# Patient Record
Sex: Female | Born: 1980 | Race: White | Hispanic: No | Marital: Married | Smoking: Never smoker
Health system: Southern US, Community
[De-identification: ages and names within clinical notes are randomized; demographics above are authoritative.]

## PROBLEM LIST (undated history)

## (undated) ENCOUNTER — Inpatient Hospital Stay (HOSPITAL_COMMUNITY): Payer: Self-pay

## (undated) DIAGNOSIS — N879 Dysplasia of cervix uteri, unspecified: Secondary | ICD-10-CM

## (undated) DIAGNOSIS — R112 Nausea with vomiting, unspecified: Secondary | ICD-10-CM

## (undated) DIAGNOSIS — D649 Anemia, unspecified: Secondary | ICD-10-CM

## (undated) DIAGNOSIS — Z9889 Other specified postprocedural states: Secondary | ICD-10-CM

## (undated) HISTORY — DX: Dysplasia of cervix uteri, unspecified: N87.9

---
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
## 2002-01-22 ENCOUNTER — Other Ambulatory Visit: Admission: RE | Admit: 2002-01-22 | Discharge: 2002-01-22 | Payer: Self-pay | Admitting: Family Medicine

## 2003-01-14 ENCOUNTER — Other Ambulatory Visit: Admission: RE | Admit: 2003-01-14 | Discharge: 2003-01-14 | Payer: Self-pay | Admitting: Obstetrics and Gynecology

## 2004-01-17 ENCOUNTER — Other Ambulatory Visit: Admission: RE | Admit: 2004-01-17 | Discharge: 2004-01-17 | Payer: Self-pay | Admitting: Obstetrics and Gynecology

## 2004-06-10 HISTORY — PX: WISDOM TOOTH EXTRACTION: SHX21

## 2005-01-24 ENCOUNTER — Other Ambulatory Visit: Admission: RE | Admit: 2005-01-24 | Discharge: 2005-01-24 | Payer: Self-pay | Admitting: Obstetrics and Gynecology

## 2010-05-07 DIAGNOSIS — R87612 Low grade squamous intraepithelial lesion on cytologic smear of cervix (LGSIL): Secondary | ICD-10-CM | POA: Insufficient documentation

## 2010-06-10 HISTORY — PX: GYNECOLOGIC CRYOSURGERY: SHX857

## 2012-06-10 NOTE — L&D Delivery Note (Signed)
Delivery Note  SVD viable Female Apgars 9,9 over 2nd degree ML lac.  Placenta delivered spontaneously intact with 3VC. Repair with 2-0 Chromic with good support and hemostasis noted and R/V exam confirms.  PH art was sent.  [place] cord blood was not done Pt continued to have uterine atony with bleeding with massage.  Manual exam of the uterus evacuated clots and no residual placenta palpated.  Re exam of placenta also revealed no obvious missing placenta.  Patient given pitocin IV, Methergine IM (0.2mg ) then cytotec per rectum.  Now only min - mild bleeding with vigorous massage. Total ebl 800cc PPH protocol was activated but patient other than some nausea states shes ok.  Mother and baby were doing well.  EBL 800cc  Candice Camp, MD

## 2012-07-09 LAB — OB RESULTS CONSOLE RUBELLA ANTIBODY, IGM: Rubella: IMMUNE

## 2012-07-09 LAB — OB RESULTS CONSOLE HIV ANTIBODY (ROUTINE TESTING): HIV: NONREACTIVE

## 2012-07-09 LAB — OB RESULTS CONSOLE ABO/RH: RH Type: NEGATIVE

## 2012-07-31 ENCOUNTER — Inpatient Hospital Stay (HOSPITAL_COMMUNITY): Admission: AD | Admit: 2012-07-31 | Payer: Self-pay | Source: Ambulatory Visit | Admitting: Obstetrics and Gynecology

## 2012-12-17 ENCOUNTER — Inpatient Hospital Stay (HOSPITAL_COMMUNITY): Admission: AD | Admit: 2012-12-17 | Payer: Self-pay | Source: Ambulatory Visit | Admitting: Obstetrics and Gynecology

## 2013-01-18 LAB — OB RESULTS CONSOLE GBS: GBS: POSITIVE

## 2013-02-01 ENCOUNTER — Inpatient Hospital Stay (HOSPITAL_COMMUNITY)
Admission: AD | Admit: 2013-02-01 | Discharge: 2013-02-04 | DRG: 372 | Disposition: A | Discharge status: Home / Self Care | Payer: BC Managed Care – PPO | Source: Ambulatory Visit | Attending: Obstetrics and Gynecology | Admitting: Obstetrics and Gynecology

## 2013-02-01 ENCOUNTER — Encounter (HOSPITAL_COMMUNITY): Payer: Self-pay

## 2013-02-01 ENCOUNTER — Inpatient Hospital Stay (HOSPITAL_COMMUNITY): Payer: BC Managed Care – PPO | Admitting: Anesthesiology

## 2013-02-01 ENCOUNTER — Encounter (HOSPITAL_COMMUNITY): Payer: Self-pay | Admitting: Anesthesiology

## 2013-02-01 DIAGNOSIS — O99892 Other specified diseases and conditions complicating childbirth: Secondary | ICD-10-CM | POA: Diagnosis present

## 2013-02-01 DIAGNOSIS — Z22330 Carrier of Group B streptococcus: Secondary | ICD-10-CM

## 2013-02-01 HISTORY — DX: Other specified postprocedural states: Z98.890

## 2013-02-01 LAB — CBC
HCT: 32.7 % — ABNORMAL LOW (ref 36.0–46.0)
Hemoglobin: 11.3 g/dL — ABNORMAL LOW (ref 12.0–15.0)
MCH: 31.9 pg (ref 26.0–34.0)
MCHC: 34.6 g/dL (ref 30.0–36.0)
RBC: 3.54 MIL/uL — ABNORMAL LOW (ref 3.87–5.11)

## 2013-02-01 MED ORDER — ONDANSETRON HCL 4 MG/2ML IJ SOLN
4.0000 mg | Freq: Four times a day (QID) | INTRAMUSCULAR | Status: DC | PRN
  Administered 2013-02-02: 4 mg via INTRAVENOUS
  Filled 2013-02-01: qty 2

## 2013-02-01 MED ORDER — FENTANYL 2.5 MCG/ML BUPIVACAINE 1/10 % EPIDURAL INFUSION (WH - ANES)
14.0000 mL/h | INTRAMUSCULAR | Status: DC | PRN
  Administered 2013-02-01: 14 mL/h via EPIDURAL
  Filled 2013-02-01: qty 125

## 2013-02-01 MED ORDER — TERBUTALINE SULFATE 1 MG/ML IJ SOLN: 0.2500 mg | Freq: Once | INTRAMUSCULAR | Status: AC | PRN

## 2013-02-01 MED ORDER — LIDOCAINE HCL (PF) 1 % IJ SOLN
INTRAMUSCULAR | Status: DC | PRN
  Administered 2013-02-01 (×4): 4 mL

## 2013-02-01 MED ORDER — EPHEDRINE 5 MG/ML INJ
10.0000 mg | INTRAVENOUS | Status: DC | PRN
  Filled 2013-02-01: qty 2

## 2013-02-01 MED ORDER — LIDOCAINE HCL (PF) 1 % IJ SOLN
30.0000 mL | INTRAMUSCULAR | Status: AC | PRN
  Administered 2013-02-02: 30 mL via SUBCUTANEOUS
  Filled 2013-02-01 (×2): qty 30

## 2013-02-01 MED ORDER — OXYTOCIN BOLUS FROM INFUSION
500.0000 mL | INTRAVENOUS | Status: DC
  Administered 2013-02-02: 500 mL via INTRAVENOUS

## 2013-02-01 MED ORDER — PHENYLEPHRINE 40 MCG/ML (10ML) SYRINGE FOR IV PUSH (FOR BLOOD PRESSURE SUPPORT)
80.0000 ug | PREFILLED_SYRINGE | INTRAVENOUS | Status: DC | PRN
  Filled 2013-02-01: qty 2

## 2013-02-01 MED ORDER — SODIUM CHLORIDE 0.9 % IV SOLN
2.0000 g | Freq: Four times a day (QID) | INTRAVENOUS | Status: DC
  Administered 2013-02-01 (×2): 2 g via INTRAVENOUS
  Filled 2013-02-01 (×5): qty 2000

## 2013-02-01 MED ORDER — OXYTOCIN 40 UNITS IN LACTATED RINGERS INFUSION - SIMPLE MED
62.5000 mL/h | INTRAVENOUS | Status: DC
  Administered 2013-02-02 (×2): 62.5 mL/h via INTRAVENOUS
  Filled 2013-02-01 (×2): qty 1000

## 2013-02-01 MED ORDER — EPHEDRINE 5 MG/ML INJ
10.0000 mg | INTRAVENOUS | Status: DC | PRN
  Filled 2013-02-01: qty 2
  Filled 2013-02-01: qty 4

## 2013-02-01 MED ORDER — ACETAMINOPHEN 325 MG PO TABS: 650.0000 mg | ORAL_TABLET | ORAL | Status: DC | PRN

## 2013-02-01 MED ORDER — OXYTOCIN 40 UNITS IN LACTATED RINGERS INFUSION - SIMPLE MED
1.0000 m[IU]/min | INTRAVENOUS | Status: DC
  Administered 2013-02-01: 2 m[IU]/min via INTRAVENOUS

## 2013-02-01 MED ORDER — OXYCODONE-ACETAMINOPHEN 5-325 MG PO TABS: 1.0000 | ORAL_TABLET | ORAL | Status: DC | PRN

## 2013-02-01 MED ORDER — CITRIC ACID-SODIUM CITRATE 334-500 MG/5ML PO SOLN: 30.0000 mL | ORAL | Status: DC | PRN

## 2013-02-01 MED ORDER — DIPHENHYDRAMINE HCL 50 MG/ML IJ SOLN: 12.5000 mg | INTRAMUSCULAR | Status: DC | PRN

## 2013-02-01 MED ORDER — IBUPROFEN 600 MG PO TABS: 600.0000 mg | ORAL_TABLET | Freq: Four times a day (QID) | ORAL | Status: DC | PRN

## 2013-02-01 MED ORDER — LACTATED RINGERS IV SOLN
INTRAVENOUS | Status: DC
  Administered 2013-02-01 (×2): via INTRAVENOUS

## 2013-02-01 MED ORDER — PHENYLEPHRINE 40 MCG/ML (10ML) SYRINGE FOR IV PUSH (FOR BLOOD PRESSURE SUPPORT)
80.0000 ug | PREFILLED_SYRINGE | INTRAVENOUS | Status: DC | PRN
  Filled 2013-02-01: qty 5
  Filled 2013-02-01: qty 2

## 2013-02-01 MED ORDER — LACTATED RINGERS IV SOLN
500.0000 mL | INTRAVENOUS | Status: DC | PRN
  Administered 2013-02-02: 800 mL via INTRAVENOUS

## 2013-02-01 MED ORDER — LACTATED RINGERS IV SOLN: 500.0000 mL | Freq: Once | INTRAVENOUS | Status: DC

## 2013-02-01 NOTE — Anesthesia Procedure Notes (Signed)
Epidural Patient location during procedure: OB Start time: 02/01/2013 7:12 PM  Staffing Performed by: anesthesiologist   Preanesthetic Checklist Completed: patient identified, site marked, surgical consent, pre-op evaluation, timeout performed, IV checked, risks and benefits discussed and monitors and equipment checked  Epidural Patient position: sitting Prep: site prepped and draped and DuraPrep Patient monitoring: continuous pulse ox and blood pressure Approach: midline Injection technique: LOR air  Needle:  Needle type: Tuohy  Needle gauge: 17 G Needle length: 9 cm and 9 Needle insertion depth: 4.5 cm Catheter type: closed end flexible Catheter size: 19 Gauge Catheter at skin depth: 9.5 cm Test dose: negative  Assessment Events: blood not aspirated, injection not painful, no injection resistance, negative IV test and no paresthesia  Additional Notes Discussed risk of headache, infection, bleeding, nerve injury and failed or incomplete block.  Patient voices understanding and wishes to proceed.  Epidural placed easily on first attempt.  No paresthesia.  Patient tolerated procedure well with no apparent complications.  Jasmine December, MDReason for block:procedure for pain

## 2013-02-01 NOTE — Anesthesia Preprocedure Evaluation (Signed)

## 2013-02-01 NOTE — H&P (Signed)
Desiree Garza is a 32 y.o. female presenting for labor sxs.  Seen in office noted to be 7 cm and GBS+ admiited for abx and labor. History OB History   Grav Para Term Preterm Abortions TAB SAB Ect Mult Living   1              Past Medical History  Diagnosis Date  . Medical history non-contributory    Past Surgical History  Procedure Laterality Date  . Gynecologic cryosurgery  2012  . Wisdom tooth extraction  2006   Family History: family history is not on file. Social History:  reports that she does not drink alcohol or use illicit drugs. Her tobacco history is not on file.   Prenatal Transfer Tool  Maternal Diabetes: No Genetic Screening: Normal Maternal Ultrasounds/Referrals: Normal Fetal Ultrasounds or other Referrals:  None Maternal Substance Abuse:  No Significant Maternal Medications:  None Significant Maternal Lab Results:  None Other Comments:  None  ROS  Dilation: 6.5 Effacement (%): 90 Station: 0 Exam by:: L. Mcdaniel RN Blood pressure 127/78, pulse 77, temperature 98.2 F (36.8 C), temperature source Oral, resp. rate 18, height 5\' 3"  (1.6 m), weight 57.153 kg (126 lb). Exam Physical Exam  Prenatal labs: ABO, Rh: A/Negative/-- (01/30 0000) Antibody: Negative (01/30 0000) Rubella: Immune (01/30 0000) RPR: Nonreactive (01/30 0000)  HBsAg: Negative (01/30 0000)  HIV: Non-reactive (01/30 0000)  GBS: Positive, Positive, Positive, Positive, Positive (08/11 0000)   Assessment/Plan: IUP term in acive labor Abx for GBS Anticipate svd   Ilias Stcharles C 02/01/2013, 5:35 PM

## 2013-02-02 ENCOUNTER — Encounter (HOSPITAL_COMMUNITY): Payer: Self-pay | Admitting: *Deleted

## 2013-02-02 LAB — POSTPARTUM HEMORRHAGE PROTOCOL (BB NOTIFICATION)

## 2013-02-02 LAB — CBC
Hemoglobin: 7.5 g/dL — ABNORMAL LOW (ref 12.0–15.0)
MCH: 32.3 pg (ref 26.0–34.0)
RBC: 2.32 MIL/uL — ABNORMAL LOW (ref 3.87–5.11)
WBC: 11.4 10*3/uL — ABNORMAL HIGH (ref 4.0–10.5)

## 2013-02-02 LAB — DIC (DISSEMINATED INTRAVASCULAR COAGULATION)PANEL
Platelets: 137 10*3/uL — ABNORMAL LOW (ref 150–400)
Smear Review: NONE SEEN

## 2013-02-02 LAB — ABO/RH: ABO/RH(D): A NEG

## 2013-02-02 MED ORDER — LANOLIN HYDROUS EX OINT: TOPICAL_OINTMENT | CUTANEOUS | Status: DC | PRN

## 2013-02-02 MED ORDER — DIBUCAINE 1 % RE OINT: 1.0000 "application " | TOPICAL_OINTMENT | RECTAL | Status: DC | PRN

## 2013-02-02 MED ORDER — SIMETHICONE 80 MG PO CHEW: 80.0000 mg | CHEWABLE_TABLET | ORAL | Status: DC | PRN

## 2013-02-02 MED ORDER — WITCH HAZEL-GLYCERIN EX PADS: 1.0000 "application " | MEDICATED_PAD | CUTANEOUS | Status: DC | PRN

## 2013-02-02 MED ORDER — BENZOCAINE-MENTHOL 20-0.5 % EX AERO
1.0000 "application " | INHALATION_SPRAY | CUTANEOUS | Status: DC | PRN
  Administered 2013-02-02: 1 via TOPICAL
  Filled 2013-02-02: qty 56

## 2013-02-02 MED ORDER — DIPHENHYDRAMINE HCL 25 MG PO CAPS: 25.0000 mg | ORAL_CAPSULE | Freq: Four times a day (QID) | ORAL | Status: DC | PRN

## 2013-02-02 MED ORDER — METHYLERGONOVINE MALEATE 0.2 MG PO TABS
ORAL_TABLET | ORAL | Status: AC
  Administered 2013-02-02: 0.2 mg via ORAL
  Filled 2013-02-02: qty 1

## 2013-02-02 MED ORDER — TETANUS-DIPHTH-ACELL PERTUSSIS 5-2.5-18.5 LF-MCG/0.5 IM SUSP: 0.5000 mL | Freq: Once | INTRAMUSCULAR | Status: DC

## 2013-02-02 MED ORDER — MISOPROSTOL 200 MCG PO TABS
ORAL_TABLET | ORAL | Status: AC
Start: 2013-02-02 — End: 2013-02-02
  Administered 2013-02-02: 800 ug via RECTAL
  Filled 2013-02-02: qty 4

## 2013-02-02 MED ORDER — MEDROXYPROGESTERONE ACETATE 150 MG/ML IM SUSP: 150.0000 mg | INTRAMUSCULAR | Status: DC | PRN

## 2013-02-02 MED ORDER — METHYLERGONOVINE MALEATE 0.2 MG/ML IJ SOLN: 0.2000 mg | Freq: Once | INTRAMUSCULAR | Status: DC

## 2013-02-02 MED ORDER — MISOPROSTOL 200 MCG PO TABS
800.0000 ug | ORAL_TABLET | Freq: Once | ORAL | Status: AC
  Administered 2013-02-02: 800 ug via RECTAL

## 2013-02-02 MED ORDER — OXYCODONE-ACETAMINOPHEN 5-325 MG PO TABS
1.0000 | ORAL_TABLET | ORAL | Status: DC | PRN
  Administered 2013-02-02: 1 via ORAL
  Filled 2013-02-02: qty 1

## 2013-02-02 MED ORDER — PRENATAL MULTIVITAMIN CH
1.0000 | ORAL_TABLET | Freq: Every day | ORAL | Status: DC
  Administered 2013-02-02 – 2013-02-04 (×3): 1 via ORAL
  Filled 2013-02-02 (×3): qty 1

## 2013-02-02 MED ORDER — METHYLERGONOVINE MALEATE 0.2 MG PO TABS
0.2000 mg | ORAL_TABLET | Freq: Once | ORAL | Status: AC
  Administered 2013-02-02: 0.2 mg via ORAL
  Filled 2013-02-02: qty 1

## 2013-02-02 MED ORDER — METHYLERGONOVINE MALEATE 0.2 MG/ML IJ SOLN
INTRAMUSCULAR | Status: AC
Start: 2013-02-02 — End: 2013-02-02
  Administered 2013-02-02: 0.2 mg via INTRAMUSCULAR
  Filled 2013-02-02: qty 1

## 2013-02-02 MED ORDER — ONDANSETRON HCL 4 MG/2ML IJ SOLN: 4.0000 mg | INTRAMUSCULAR | Status: DC | PRN

## 2013-02-02 MED ORDER — ONDANSETRON HCL 4 MG PO TABS: 4.0000 mg | ORAL_TABLET | ORAL | Status: DC | PRN

## 2013-02-02 MED ORDER — IBUPROFEN 600 MG PO TABS
600.0000 mg | ORAL_TABLET | Freq: Four times a day (QID) | ORAL | Status: DC
  Administered 2013-02-02 – 2013-02-04 (×10): 600 mg via ORAL
  Filled 2013-02-02 (×10): qty 1

## 2013-02-02 MED ORDER — PRENATAL MULTIVITAMIN CH: 1.0000 | ORAL_TABLET | Freq: Every day | ORAL | Status: DC

## 2013-02-02 MED ORDER — MEASLES, MUMPS & RUBELLA VAC ~~LOC~~ INJ: 0.5000 mL | INJECTION | Freq: Once | SUBCUTANEOUS | Status: DC

## 2013-02-02 MED ORDER — METHYLERGONOVINE MALEATE 0.2 MG PO TABS
0.2000 mg | ORAL_TABLET | Freq: Four times a day (QID) | ORAL | Status: AC
  Administered 2013-02-02 (×4): 0.2 mg via ORAL
  Filled 2013-02-02 (×3): qty 1

## 2013-02-02 MED ORDER — ZOLPIDEM TARTRATE 5 MG PO TABS: 5.0000 mg | ORAL_TABLET | Freq: Every evening | ORAL | Status: DC | PRN

## 2013-02-02 MED ORDER — METHYLERGONOVINE MALEATE 0.2 MG/ML IJ SOLN
0.2000 mg | Freq: Once | INTRAMUSCULAR | Status: DC
  Administered 2013-02-02: 0.2 mg via INTRAMUSCULAR

## 2013-02-02 MED ORDER — SENNOSIDES-DOCUSATE SODIUM 8.6-50 MG PO TABS
2.0000 | ORAL_TABLET | Freq: Every day | ORAL | Status: DC
  Administered 2013-02-02 – 2013-02-03 (×2): 2 via ORAL

## 2013-02-02 NOTE — Progress Notes (Signed)
Post Partum Day 1 Subjective: no complaints, tolerating PO and bleeding minimal, has not ambulated since moved to pp unit Denies dizziness or SOB Objective: Blood pressure 98/63, pulse 85, temperature 98.3 F (36.8 C), temperature source Oral, resp. rate 16, height 5\' 3"  (1.6 m), weight 126 lb (57.153 kg), SpO2 98.00%, unknown if currently breastfeeding.  Physical Exam:  General: alert and cooperative Lochia: appropriate Uterine Fundus: firm Incision: perineum intact DVT Evaluation: No evidence of DVT seen on physical exam. Negative Homan's sign. No cords or calf tenderness. No significant calf/ankle edema.   Recent Labs  02/01/13 1600 02/02/13 0550  HGB 11.3* 7.5*  HCT 32.7* 22.0*    Assessment/Plan: Plan for discharge tomorrow and Circumcision prior to discharge May discontinue foley if patient is ambulating well. CBC in am  LOS: 1 day   River Mckercher G 02/02/2013, 8:19 AM

## 2013-02-03 LAB — CBC
HCT: 18 % — ABNORMAL LOW (ref 36.0–46.0)
Hemoglobin: 6.4 g/dL — CL (ref 12.0–15.0)
MCV: 93.3 fL (ref 78.0–100.0)
RBC: 1.93 MIL/uL — ABNORMAL LOW (ref 3.87–5.11)
WBC: 7.7 10*3/uL (ref 4.0–10.5)

## 2013-02-03 MED ORDER — FERROUS SULFATE 325 (65 FE) MG PO TABS
325.0000 mg | ORAL_TABLET | Freq: Three times a day (TID) | ORAL | Status: DC
  Administered 2013-02-03 – 2013-02-04 (×3): 325 mg via ORAL
  Filled 2013-02-03 (×3): qty 1

## 2013-02-03 MED ORDER — RHO D IMMUNE GLOBULIN 1500 UNIT/2ML IJ SOLN
300.0000 ug | Freq: Once | INTRAMUSCULAR | Status: AC
  Administered 2013-02-03: 300 ug via INTRAMUSCULAR
  Filled 2013-02-03: qty 2

## 2013-02-03 NOTE — Progress Notes (Addendum)
Post Partum Day 2 Subjective: no complaints, up ad lib, voiding and tolerating PO  Objective: Blood pressure 94/59, pulse 73, temperature 98 F (36.7 C), temperature source Oral, resp. rate 18, height 5\' 3"  (1.6 m), weight 126 lb (57.153 kg), SpO2 99.00%, unknown if currently breastfeeding.  Physical Exam:  General: alert and cooperative Lochia: appropriate Uterine Fundus: firm Incision: perineum intact DVT Evaluation: No evidence of DVT seen on physical exam. Negative Homan's sign. No cords or calf tenderness. No significant calf/ankle edema.   Recent Labs  02/02/13 0550 02/03/13 0600  HGB 7.5* 6.4*  HCT 22.0* 18.0*    Assessment/Plan: Plan for discharge tomorrow and Circumcision prior to discharge Feso4  cbc in am  LOS: 2 days   Rohan Juenger G 02/03/2013, 8:10 AM

## 2013-02-04 LAB — CBC
Hemoglobin: 5.9 g/dL — CL (ref 12.0–15.0)
MCH: 32.1 pg (ref 26.0–34.0)
MCV: 93.5 fL (ref 78.0–100.0)
RBC: 1.84 MIL/uL — ABNORMAL LOW (ref 3.87–5.11)
WBC: 5.7 10*3/uL (ref 4.0–10.5)

## 2013-02-04 LAB — RH IG WORKUP (INCLUDES ABO/RH)
Fetal Screen: NEGATIVE
Unit division: 0

## 2013-02-04 MED ORDER — FERROUS SULFATE 325 (65 FE) MG PO TABS: 325.0000 mg | ORAL_TABLET | Freq: Three times a day (TID) | ORAL | Status: DC

## 2013-02-04 MED ORDER — IBUPROFEN 600 MG PO TABS: 600.0000 mg | ORAL_TABLET | Freq: Four times a day (QID) | ORAL | Status: DC

## 2013-02-04 NOTE — Discharge Summary (Signed)
Obstetric Discharge Summary Reason for Admission: onset of labor Prenatal Procedures: ultrasound Intrapartum Procedures: spontaneous vaginal delivery Postpartum Procedures: none Complications-Operative and Postpartum: 2 degree perineal laceration Hemoglobin  Date Value Range Status  02/04/2013 5.9* 12.0 - 15.0 g/dL Final     REPEATED TO VERIFY     CRITICAL RESULT CALLED TO, READ BACK BY AND VERIFIED WITH:     LEE, B. @ 0630 ON 02/04/2013 BY [email or web address]     HCT  Date Value Range Status  02/04/2013 17.2* 36.0 - 46.0 % Final    Physical Exam:  General: alert and cooperative, ambulating without dizziness or SOB Lochia: appropriate Uterine Fundus: firm Incision: perineum intact DVT Evaluation: No evidence of DVT seen on physical exam. Negative Homan's sign. No cords or calf tenderness. No significant calf/ankle edema.  Discharge Diagnoses: Term Pregnancy-delivered  Discharge Information: Date: 02/04/2013 Activity: pelvic rest Diet: routine Medications: PNV, Ibuprofen and Iron Condition: stable Instructions: refer to practice specific booklet Discharge to: home   Newborn Data: Live born female  Birth Weight: 6 lb 12.3 oz (3070 g) APGAR: 8, 9  Home with mother.  Mykah Bellomo G 02/04/2013, 8:14 AM

## 2013-02-04 NOTE — Anesthesia Postprocedure Evaluation (Signed)
Patient stable following vaginal delivery.  

## 2013-02-04 NOTE — Progress Notes (Signed)
CRITICAL VALUE ALERT  Critical value received:  Hgb 5.9  Date of notification:  02-04-13  Time of notification:  0630  Critical value read back:yes  Nurse who received alert:  Baltazar Apo RN  MD notified (1st page):  N/A  Time of first page:   MD notified (2nd page):  Time of second page:  Responding MD:  N/A  Time MD responded:  N/A

## 2013-02-05 LAB — TYPE AND SCREEN
ABO/RH(D): A NEG
Antibody Screen: NEGATIVE
Unit division: 0

## 2013-02-15 ENCOUNTER — Ambulatory Visit (HOSPITAL_COMMUNITY)
Admission: RE | Admit: 2013-02-15 | Discharge: 2013-02-15 | Disposition: A | Discharge status: Home / Self Care | Payer: BC Managed Care – PPO | Source: Ambulatory Visit | Attending: Obstetrics and Gynecology | Admitting: Obstetrics and Gynecology

## 2013-02-15 NOTE — Lactation Note (Signed)
Adult Lactation Consultation Outpatient Visit Note  Patient Name: Desiree Garza Date of Birth: 21-Dec-1980 Gestational Age at Delivery: Unknown Type of Delivery:   Breastfeeding History: Frequency of Breastfeeding: q 1 1/2 - 3 hours Length of Feeding: 30 min Voids: QS Stools: QS  Supplementing / Method: None Pumping:  Type of Pump:   Frequency:  Volume:    Comments:    Consultation Evaluation: Mom reports that breast feeding is going much better. Reports that her nipples are much improved. Reports that baby was very fussy before appointment so she nursed him for 10 minutes before they came.  Initial Feeding Assessment: Pre-feed Weight: 7-5.9  3342g Post-feed Weight: 7- 7.5  3388g Amount Transferred: 46 cc's Comments: Troy nursed for 15 minutes here. Then off to sleep. Mom's nipples are still slightly pink but intact with no scabs noted. Breast much softer after nursing and lots of swallows noted. Great weight gain. Praise given. No further questions at present. To call prn   Total Breast milk Transferred this Visit: 46 cc's Total Supplement Given: 0  Additional Interventions:   Follow-Up With Ped To call prn with questions/ concerns.     Pamelia Hoit 02/15/2013, 5:01 PM

## 2014-04-11 ENCOUNTER — Encounter (HOSPITAL_COMMUNITY): Payer: Self-pay | Admitting: *Deleted

## 2014-12-05 LAB — OB RESULTS CONSOLE HIV ANTIBODY (ROUTINE TESTING): HIV: NONREACTIVE

## 2014-12-08 LAB — OB RESULTS CONSOLE RPR: RPR: NONREACTIVE

## 2014-12-08 LAB — OB RESULTS CONSOLE HEPATITIS B SURFACE ANTIGEN: Hepatitis B Surface Ag: NEGATIVE

## 2014-12-08 LAB — OB RESULTS CONSOLE RUBELLA ANTIBODY, IGM: RUBELLA: IMMUNE

## 2014-12-08 LAB — OB RESULTS CONSOLE HIV ANTIBODY (ROUTINE TESTING): HIV: NONREACTIVE

## 2014-12-28 LAB — OB RESULTS CONSOLE GC/CHLAMYDIA
CHLAMYDIA, DNA PROBE: NEGATIVE
GC PROBE AMP, GENITAL: NEGATIVE

## 2015-06-11 NOTE — L&D Delivery Note (Signed)
Operative Delivery Note At 6:15 PM a viable female was delivered via Vaginal, Vacuum Investment banker, operational).  Presentation: vertex; Position: Left,, Right,, Anterior; Station: +2.  Verbal consent: obtained from patient.  Risks and benefits discussed in detail.  Risks include, but are not limited to the risks of anesthesia, bleeding, infection, damage to maternal tissues, fetal cephalhematoma.  There is also the risk of inability to effect vaginal delivery of the head, or shoulder dystocia that cannot be resolved by established maneuvers, leading to the need for emergency cesarean section.  APGAR: 8, 9; weight  .   Placenta status: Intact, Spontaneous.   Cord: 3 vessels with the following complications: None.  Cord pH: not obtained  Anesthesia: Epidural  Instruments: mushroom delivered with 1 pull no popoff Episiotomy: None Lacerations: 2nd degree;Perineal Suture Repair: 2.0 chromic Est. Blood Loss (mL):  500  Mom to postpartum.  Baby to Couplet care / Skin to Skin.  Marcena Dias L 07/07/2015, 6:31 PM

## 2015-07-07 ENCOUNTER — Encounter (HOSPITAL_COMMUNITY): Payer: Self-pay | Admitting: *Deleted

## 2015-07-07 ENCOUNTER — Inpatient Hospital Stay (HOSPITAL_COMMUNITY)
Admission: AD | Admit: 2015-07-07 | Discharge: 2015-07-09 | DRG: 775 | Disposition: A | Discharge status: Home / Self Care | Payer: 59 | Source: Ambulatory Visit | Attending: Obstetrics and Gynecology | Admitting: Obstetrics and Gynecology

## 2015-07-07 ENCOUNTER — Inpatient Hospital Stay (HOSPITAL_COMMUNITY): Payer: 59 | Admitting: Anesthesiology

## 2015-07-07 DIAGNOSIS — R55 Syncope and collapse: Secondary | ICD-10-CM | POA: Diagnosis present

## 2015-07-07 DIAGNOSIS — Z3A38 38 weeks gestation of pregnancy: Secondary | ICD-10-CM | POA: Diagnosis not present

## 2015-07-07 DIAGNOSIS — Z809 Family history of malignant neoplasm, unspecified: Secondary | ICD-10-CM | POA: Diagnosis not present

## 2015-07-07 LAB — CBC
HCT: 33.8 % — ABNORMAL LOW (ref 36.0–46.0)
HEMOGLOBIN: 11.3 g/dL — AB (ref 12.0–15.0)
MCH: 31.7 pg (ref 26.0–34.0)
MCHC: 33.4 g/dL (ref 30.0–36.0)
MCV: 94.7 fL (ref 78.0–100.0)
PLATELETS: 160 10*3/uL (ref 150–400)
RBC: 3.57 MIL/uL — AB (ref 3.87–5.11)
RDW: 12.9 % (ref 11.5–15.5)
WBC: 7.7 10*3/uL (ref 4.0–10.5)

## 2015-07-07 LAB — TYPE AND SCREEN
ABO/RH(D): A NEG
ANTIBODY SCREEN: NEGATIVE

## 2015-07-07 MED ORDER — LACTATED RINGERS IV SOLN
500.0000 mL | INTRAVENOUS | Status: DC | PRN
  Administered 2015-07-07: 500 mL via INTRAVENOUS

## 2015-07-07 MED ORDER — FLEET ENEMA 7-19 GM/118ML RE ENEM: 1.0000 | ENEMA | Freq: Every day | RECTAL | Status: DC | PRN

## 2015-07-07 MED ORDER — WITCH HAZEL-GLYCERIN EX PADS: 1.0000 "application " | MEDICATED_PAD | CUTANEOUS | Status: DC | PRN

## 2015-07-07 MED ORDER — OXYTOCIN 10 UNIT/ML IJ SOLN
2.5000 [IU]/h | INTRAMUSCULAR | Status: DC
  Filled 2015-07-07: qty 4

## 2015-07-07 MED ORDER — CITRIC ACID-SODIUM CITRATE 334-500 MG/5ML PO SOLN
30.0000 mL | ORAL | Status: DC | PRN
Start: 2015-07-07 — End: 2015-07-07

## 2015-07-07 MED ORDER — ACETAMINOPHEN 325 MG PO TABS: 650.0000 mg | ORAL_TABLET | ORAL | Status: DC | PRN

## 2015-07-07 MED ORDER — SENNOSIDES-DOCUSATE SODIUM 8.6-50 MG PO TABS
2.0000 | ORAL_TABLET | ORAL | Status: DC
  Administered 2015-07-08 – 2015-07-09 (×2): 2 via ORAL
  Filled 2015-07-07 (×2): qty 2

## 2015-07-07 MED ORDER — FENTANYL 2.5 MCG/ML BUPIVACAINE 1/10 % EPIDURAL INFUSION (WH - ANES)
14.0000 mL/h | INTRAMUSCULAR | Status: DC | PRN
  Administered 2015-07-07: 14 mL/h via EPIDURAL
  Filled 2015-07-07: qty 125

## 2015-07-07 MED ORDER — OXYCODONE-ACETAMINOPHEN 5-325 MG PO TABS: 2.0000 | ORAL_TABLET | ORAL | Status: DC | PRN

## 2015-07-07 MED ORDER — ZOLPIDEM TARTRATE 5 MG PO TABS: 5.0000 mg | ORAL_TABLET | Freq: Every evening | ORAL | Status: DC | PRN

## 2015-07-07 MED ORDER — TERBUTALINE SULFATE 1 MG/ML IJ SOLN
0.2500 mg | Freq: Once | INTRAMUSCULAR | Status: DC | PRN
  Filled 2015-07-07: qty 1

## 2015-07-07 MED ORDER — ONDANSETRON HCL 4 MG/2ML IJ SOLN: 4.0000 mg | INTRAMUSCULAR | Status: DC | PRN

## 2015-07-07 MED ORDER — MEDROXYPROGESTERONE ACETATE 150 MG/ML IM SUSP: 150.0000 mg | INTRAMUSCULAR | Status: DC | PRN

## 2015-07-07 MED ORDER — PHENYLEPHRINE 40 MCG/ML (10ML) SYRINGE FOR IV PUSH (FOR BLOOD PRESSURE SUPPORT)
80.0000 ug | PREFILLED_SYRINGE | INTRAVENOUS | Status: DC | PRN
  Filled 2015-07-07: qty 2
  Filled 2015-07-07: qty 20

## 2015-07-07 MED ORDER — LACTATED RINGERS IV SOLN
INTRAVENOUS | Status: DC
  Administered 2015-07-07 (×2): via INTRAVENOUS

## 2015-07-07 MED ORDER — DIBUCAINE 1 % RE OINT: 1.0000 "application " | TOPICAL_OINTMENT | RECTAL | Status: DC | PRN

## 2015-07-07 MED ORDER — OXYTOCIN BOLUS FROM INFUSION: 500.0000 mL | INTRAVENOUS | Status: DC

## 2015-07-07 MED ORDER — IBUPROFEN 600 MG PO TABS
600.0000 mg | ORAL_TABLET | Freq: Four times a day (QID) | ORAL | Status: DC
  Administered 2015-07-08 – 2015-07-09 (×6): 600 mg via ORAL
  Filled 2015-07-07 (×6): qty 1

## 2015-07-07 MED ORDER — EPHEDRINE 5 MG/ML INJ
10.0000 mg | INTRAVENOUS | Status: DC | PRN
  Filled 2015-07-07: qty 2

## 2015-07-07 MED ORDER — LANOLIN HYDROUS EX OINT: TOPICAL_OINTMENT | CUTANEOUS | Status: DC | PRN

## 2015-07-07 MED ORDER — OXYTOCIN 10 UNIT/ML IJ SOLN
1.0000 m[IU]/min | INTRAVENOUS | Status: DC
  Administered 2015-07-07: 1 m[IU]/min via INTRAVENOUS

## 2015-07-07 MED ORDER — BISACODYL 10 MG RE SUPP
10.0000 mg | Freq: Every day | RECTAL | Status: DC | PRN
Start: 2015-07-07 — End: 2015-07-09

## 2015-07-07 MED ORDER — DIPHENHYDRAMINE HCL 50 MG/ML IJ SOLN: 12.5000 mg | INTRAMUSCULAR | Status: DC | PRN

## 2015-07-07 MED ORDER — ONDANSETRON HCL 4 MG PO TABS: 4.0000 mg | ORAL_TABLET | ORAL | Status: DC | PRN

## 2015-07-07 MED ORDER — SIMETHICONE 80 MG PO CHEW: 80.0000 mg | CHEWABLE_TABLET | ORAL | Status: DC | PRN

## 2015-07-07 MED ORDER — LIDOCAINE HCL (PF) 1 % IJ SOLN
INTRAMUSCULAR | Status: DC | PRN
  Administered 2015-07-07 (×2): 6 mL via EPIDURAL

## 2015-07-07 MED ORDER — PRENATAL MULTIVITAMIN CH
1.0000 | ORAL_TABLET | Freq: Every day | ORAL | Status: DC
  Administered 2015-07-08: 1 via ORAL
  Filled 2015-07-07: qty 1

## 2015-07-07 MED ORDER — TETANUS-DIPHTH-ACELL PERTUSSIS 5-2.5-18.5 LF-MCG/0.5 IM SUSP: 0.5000 mL | Freq: Once | INTRAMUSCULAR | Status: DC

## 2015-07-07 MED ORDER — ONDANSETRON HCL 4 MG/2ML IJ SOLN: 4.0000 mg | Freq: Four times a day (QID) | INTRAMUSCULAR | Status: DC | PRN

## 2015-07-07 MED ORDER — MEASLES, MUMPS & RUBELLA VAC ~~LOC~~ INJ: 0.5000 mL | INJECTION | Freq: Once | SUBCUTANEOUS | Status: DC

## 2015-07-07 MED ORDER — DIPHENHYDRAMINE HCL 25 MG PO CAPS: 25.0000 mg | ORAL_CAPSULE | Freq: Four times a day (QID) | ORAL | Status: DC | PRN

## 2015-07-07 MED ORDER — LIDOCAINE HCL (PF) 1 % IJ SOLN
30.0000 mL | INTRAMUSCULAR | Status: DC | PRN
  Filled 2015-07-07: qty 30

## 2015-07-07 MED ORDER — BENZOCAINE-MENTHOL 20-0.5 % EX AERO
1.0000 "application " | INHALATION_SPRAY | CUTANEOUS | Status: DC | PRN
  Administered 2015-07-08: 1 via TOPICAL
  Filled 2015-07-07: qty 56

## 2015-07-07 MED ORDER — OXYCODONE-ACETAMINOPHEN 5-325 MG PO TABS: 1.0000 | ORAL_TABLET | ORAL | Status: DC | PRN

## 2015-07-07 NOTE — Progress Notes (Signed)
Patient experienced a syncopal incident while sitting on the toilet to void at 2100. Ammonia salts administered, patient taken back to bed via Stedy; placed in supine position; IV fluid bolus initiated; BP monitored.

## 2015-07-07 NOTE — Anesthesia Preprocedure Evaluation (Signed)

## 2015-07-07 NOTE — Anesthesia Procedure Notes (Signed)
Epidural Patient location during procedure: OB Start time: 07/07/2015 3:33 PM End time: 07/07/2015 3:37 PM  Staffing Anesthesiologist: Leilani Able  Preanesthetic Checklist Completed: patient identified, site marked, surgical consent, pre-op evaluation, timeout performed, risks and benefits discussed and monitors and equipment checked  Epidural Patient position: sitting Prep: site prepped and draped and DuraPrep Patient monitoring: continuous pulse ox and blood pressure Approach: midline Location: L3-L4 Injection technique: LOR air  Needle:  Needle type: Tuohy  Needle gauge: 17 G Needle length: 9 cm and 9 Needle insertion depth: 4 cm Catheter type: closed end flexible Catheter size: 19 Gauge Catheter at skin depth: 9 cm Test dose: negative and Other  Assessment Sensory level: T10 Events: blood not aspirated, injection not painful, no injection resistance, negative IV test and no paresthesia  Additional Notes Reason for block:procedure for pain

## 2015-07-07 NOTE — H&P (Signed)
Desiree Garza is a 35 y.o. G 2 P 1001 at 10 w 1 day presents in labor. History of PPH. Maternal Medical History:  Reason for admission: Contractions.     OB History    Gravida Para Term Preterm AB TAB SAB Ectopic Multiple Living   Past Medical History  Diagnosis Date  . Medical history non-contributory   . History of cryosurgery    Past Surgical History  Procedure Laterality Date  . Gynecologic cryosurgery  2012  . Wisdom tooth extraction  2006   Family History: family history includes Cancer in her father. Social History:  reports that she has never smoked. She does not have any smokeless tobacco history on file. She reports that she drinks about 0.6 oz of alcohol per week. She reports that she does not use illicit drugs.   Prenatal Transfer Tool  Maternal Diabetes: No Genetic Screening: Normal Maternal Ultrasounds/Referrals: Normal Fetal Ultrasounds or other Referrals:  None Maternal Substance Abuse:  No Significant Maternal Medications:  None Significant Maternal Lab Results:  None Other Comments:  None  Review of Systems  All other systems reviewed and are negative.   Dilation: 6 (Per Dr Vincente Poli (in office)) Exam by:: Vincente Poli, MD Blood pressure 132/87, pulse 82, temperature 98.4 F (36.9 C), temperature source Oral, resp. rate 18, SpO2 100 %, unknown if currently breastfeeding. Maternal Exam:  Uterine Assessment: Contraction frequency is regular.   Abdomen: Fetal presentation: vertex     Fetal Exam Fetal State Assessment: Category I - tracings are normal.     Physical Exam  Nursing note and vitals reviewed. Constitutional: She appears well-developed and well-nourished.  HENT:  Head: Normocephalic.  Eyes: Pupils are equal, round, and reactive to light.  Neck: Normal range of motion.  Cardiovascular: Normal rate and regular rhythm.   Respiratory: Effort normal.    Prenatal labs: ABO, Rh:   Antibody:   Rubella:   RPR:    HBsAg:     HIV:    GBS:     Assessment/Plan: IUP at 38 w 1 day Labor Anticipate NSVD   Desiree Garza L 07/07/2015, 1:30 PM

## 2015-07-08 LAB — CBC
HEMATOCRIT: 23.1 % — AB (ref 36.0–46.0)
HEMOGLOBIN: 8.2 g/dL — AB (ref 12.0–15.0)
MCH: 32.8 pg (ref 26.0–34.0)
MCHC: 35.5 g/dL (ref 30.0–36.0)
MCV: 92.4 fL (ref 78.0–100.0)
PLATELETS: 158 10*3/uL (ref 150–400)
RBC: 2.5 MIL/uL — AB (ref 3.87–5.11)
RDW: 12.7 % (ref 11.5–15.5)
WBC: 10.4 10*3/uL (ref 4.0–10.5)

## 2015-07-08 LAB — RPR: RPR Ser Ql: NONREACTIVE

## 2015-07-08 NOTE — Lactation Note (Signed)
This note was copied from the chart of Desiree Garza. Lactation Consultation Note  Patient Name: Desiree Garza ZOXWR'U Date: 07/08/2015 Reason for consult: Initial assessment Infant is 22 hours old & seen by [facility] for initial assessment. Baby was born at [redacted]w[redacted]d & weighed 7+15.3#. Baby was skin to skin with mom when LC entered. Mom reports that baby has been sleeping most of the time but did BF well ~11:40am for 15 mins (flowsheet states 11:15). Mom stated she BF her first child for 17 months and the only problems she had were recurrent plugged ducts, which she stated sunflower lecithin helped. Baby has had 4 wet & 1 stool diaper and mom & FOB report baby spit up a few times in the beginning. Provided BF booklet, feeding log, & BF resources; reviewed outpatient services & support groups. Mom reports she has a DEBP at home & is able to get another one through her insurance again. Mom stays home with children. Encouraged mom to feed on cue & at least 8-12x/24 hrs. Parents are aware that baby may cluster feed through the night. Mom reports no other concerns. Encouraged mom to ask for [facility] tomorrow to assess a BF.  Maternal Data Does the patient have breastfeeding experience prior to this delivery?: Yes  Feeding Feeding Type: Breast Fed  LATCH Score/Interventions                      Lactation Tools Discussed/Used     Consult Status Consult Status: Follow-up Date: 07/09/15 Follow-up type: In-patient    Oneal Grout 07/08/2015, 4:53 PM

## 2015-07-08 NOTE — Progress Notes (Signed)
S:  Patient is doing well No complaints.  O:  BP 85/42 mmHg  Pulse 69  Temp(Src) 98.2 F (36.8 C) (Oral)  Resp 16  Ht  (1.6 m)  Wt 124 lb (56.246 kg)  BMI 21.97 kg/m2  SpO2 98%  Breastfeeding? Unknown Results for orders placed or performed during the hospital encounter of 07/07/15 (from the past 24 hour(s))  CBC     Status: Abnormal   Collection Time: 07/07/15 11:35 AM  Result Value Ref Range   WBC 7.7 4.0 - 10.5 K/uL   RBC 3.57 (L) 3.87 - 5.11 MIL/uL   Hemoglobin 11.3 (L) 12.0 - 15.0 g/dL   HCT 72.5 (L) 36.6 - 44.0 %   MCV 94.7 78.0 - 100.0 fL   MCH 31.7 26.0 - 34.0 pg   MCHC 33.4 30.0 - 36.0 g/dL   RDW 34.7 42.5 - 95.6 %   Platelets 160 150 - 400 K/uL  RPR     Status: None   Collection Time: 07/07/15 11:35 AM  Result Value Ref Range   RPR Ser Ql Non Reactive Non Reactive  Type and screen [facility]     Status: None   Collection Time: 07/07/15 11:35 AM  Result Value Ref Range   ABO/RH(D) A NEG    Antibody Screen NEG    Sample Expiration 07/10/2015   CBC     Status: Abnormal   Collection Time: 07/08/15  5:00 AM  Result Value Ref Range   WBC 10.4 4.0 - 10.5 K/uL   RBC 2.50 (L) 3.87 - 5.11 MIL/uL   Hemoglobin 8.2 (L) 12.0 - 15.0 g/dL   HCT 38.7 (L) 56.4 - 33.2 %   MCV 92.4 78.0 - 100.0 fL   MCH 32.8 26.0 - 34.0 pg   MCHC 35.5 30.0 - 36.0 g/dL   RDW 95.1 88.4 - 16.6 %   Platelets 158 150 - 400 K/uL   Abdomen is soft and non tender Uterus is firm Lochia is normal  IMPRESSION: PPD #1 Doing well circ today/tomorrow - discussed with parents Discharge home tomorrow

## 2015-07-09 NOTE — Discharge Summary (Signed)
Obstetric Discharge Summary Reason for Admission: onset of labor Prenatal Procedures: none Intrapartum Procedures: vacuum Postpartum Procedures: none Complications-Operative and Postpartum: first degree perineal laceration HEMOGLOBIN  Date Value Ref Range Status  07/08/2015 8.2* 12.0 - 15.0 g/dL Final    Comment:    REPEATED TO VERIFY DELTA CHECK NOTED    HCT  Date Value Ref Range Status  07/08/2015 23.1* 36.0 - 46.0 % Final    Physical Exam:  General: alert, cooperative and appears stated age 35: appropriate Uterine Fundus: firm Incision: healing well, no significant drainage DVT Evaluation: No evidence of DVT seen on physical exam.  Discharge Diagnoses: Term Pregnancy-delivered  Discharge Information: Date: 07/09/2015 Activity: pelvic rest Diet: routine Medications: None Condition: improved Instructions: refer to practice specific booklet Discharge to: home   Newborn Data: Live born female  Birth Weight: 7 lb 15.3 oz (3610 g) APGAR: 8, 9  Home with mother.  Desiree Garza L 07/09/2015, 7:43 AM

## 2015-07-09 NOTE — Lactation Note (Signed)
This note was copied from the chart of Desiree Garza. Lactation Consultation Note  Latched baby easily in cross cradle w/ chin tug for increased depth. Sucks and swallows observed.  LS10. Mother denies questions or soreness. Reviewed cluster feeding.  Reviewed engorgement care and monitoring voids/stools.   Patient Name: Desiree Delia Slatten ZOXWR'U Date: 07/09/2015 Reason for consult: Follow-up assessment   Maternal Data    Feeding Feeding Type: Breast Fed  LATCH Score/Interventions Latch: Grasps breast easily, tongue down, lips flanged, rhythmical sucking.  Audible Swallowing: Spontaneous and intermittent  Type of Nipple: Everted at rest and after stimulation  Comfort (Breast/Nipple): Soft / non-tender     Hold (Positioning): No assistance needed to correctly position infant at breast.  LATCH Score: 10  Lactation Tools Discussed/Used     Consult Status Consult Status: Complete    Hardie Pulley 07/09/2015, 9:09 AM

## 2015-07-12 ENCOUNTER — Encounter (HOSPITAL_COMMUNITY): Payer: Self-pay

## 2016-03-27 ENCOUNTER — Emergency Department (HOSPITAL_BASED_OUTPATIENT_CLINIC_OR_DEPARTMENT_OTHER)
Admission: EM | Admit: 2016-03-27 | Discharge: 2016-03-28 | Disposition: A | Discharge status: Home / Self Care | Payer: 59 | Attending: Emergency Medicine | Admitting: Emergency Medicine

## 2016-03-27 ENCOUNTER — Encounter (HOSPITAL_BASED_OUTPATIENT_CLINIC_OR_DEPARTMENT_OTHER): Payer: Self-pay

## 2016-03-27 DIAGNOSIS — W260XXA Contact with knife, initial encounter: Secondary | ICD-10-CM | POA: Insufficient documentation

## 2016-03-27 DIAGNOSIS — Y929 Unspecified place or not applicable: Secondary | ICD-10-CM | POA: Diagnosis not present

## 2016-03-27 DIAGNOSIS — Y9389 Activity, other specified: Secondary | ICD-10-CM | POA: Diagnosis not present

## 2016-03-27 DIAGNOSIS — Y999 Unspecified external cause status: Secondary | ICD-10-CM | POA: Diagnosis not present

## 2016-03-27 DIAGNOSIS — S61211A Laceration without foreign body of left index finger without damage to nail, initial encounter: Secondary | ICD-10-CM | POA: Diagnosis present

## 2016-03-27 MED ORDER — LIDOCAINE HCL (PF) 1 % IJ SOLN
INTRAMUSCULAR | Status: AC
  Filled 2016-03-27: qty 5

## 2016-03-27 NOTE — ED Provider Notes (Signed)
By signing my name below, I, Phillis HaggisGabriella Gaje, attest that this documentation has been prepared under the direction and in the presence of Paula LibraJohn Mikela Senn, MD. Electronically Signed: Phillis HaggisGabriella Gaje, ED Scribe. 03/27/16. 11:41 PM.  [facility] Provider Note: Lowella DellJ. Lane Makinzi Prieur, MD, FACEP  CSN: [id number] MRN: [id number] ARRIVAL: 03/27/16 at 2321  CHIEF COMPLAINT  Laceration (finger)  HISTORY OF PRESENT ILLNESS  HPI Comments: Desiree Garza is a 35 y.o. female who presents to the Emergency Department complaining of a laceration to the tip of the right index finger occurring just PTA. Pt says that she was washing the dishes when she cut the tip of her finger on a knife. Pt reports mild pain to the area. Pt has gauze wrapped around the finger. Pt states that she is currently breastfeeding. She denies numbness or weakness to the finger. She is UTD on her tetanus.   Past Medical History:  Diagnosis Date  . History of cryosurgery   . Medical history non-contributory    Past Surgical History:  Procedure Laterality Date  . GYNECOLOGIC CRYOSURGERY  2012  . WISDOM TOOTH EXTRACTION  2006   Family History  Problem Relation Age of Onset  . Cancer Father    Social History  Substance Use Topics  . Smoking status: Never Smoker  . Smokeless tobacco: Never Used  . Alcohol use 0.6 oz/week    1 Glasses of wine per week     Comment: prior to pregnancy   Prior to Admission medications   Medication Sig Start Date End Date Taking? Authorizing Provider  Fe Cbn-Fe Gluc-FA-B12-C-DSS (FERRALET 90 PO) Take 1 tablet by mouth daily.     Historical Provider, MD  Prenatal Vit-Fe Fumarate-FA (PRENATAL MULTIVITAMIN) TABS tablet Take 1 tablet by mouth daily at 12 noon.    Historical Provider, MD   Allergies Review of patient's allergies indicates no known allergies.  REVIEW OF SYSTEMS  Negative except as noted here or in the History of Present Illness.  PHYSICAL EXAMINATION  Initial Vital  Signs Blood pressure 133/98, pulse 66, temperature 97.8 F (36.6 C), temperature source Oral, resp. rate 16, height 5\' 3"  (1.6 m), weight 98 lb (44.5 kg), SpO2 100 %, unknown if currently breastfeeding.  Examination General: Well-developed, well-nourished female in no acute distress; appearance consistent with age of record HENT: normocephalic; atraumatic Eyes: normal appearance Neck: supple Heart: regular rate and rhythm Lungs: clear to auscultation bilaterally Abdomen: soft; nondistended; nontender Extremities: No deformity; full range of motion; pulses normal; 1 cm (flap) laceration to the tip of the right index finger that continues to ooze blood Neurologic: Awake, alert and oriented; motor function intact in all extremities and symmetric; no facial droop Skin: Warm and dry Psychiatric: Normal mood and affect  RESULTS  Summary of this visit's results, reviewed by myself:   EKG Interpretation  Date/Time:    Ventricular Rate:    PR Interval:    QRS Duration:   QT Interval:    QTC Calculation:   R Axis:     Text Interpretation:        Laboratory Studies: No results found for this or any previous visit (from the past 24 hour(s)). Imaging Studies: No results found.  ED COURSE  Nursing notes and initial vitals signs, including pulse oximetry, reviewed.  Vitals:   03/27/16 2327 03/27/16 2328  BP: 133/98   Pulse: 66   Resp: 16   Temp: 97.8 F (36.6 C)   TempSrc: Oral   SpO2: 100%  Weight:  98 lb (44.5 kg)  Height:  5\' 3"  (1.6 m)    PROCEDURES   LACERATION REPAIR Performed by: Paula Libra, MD Consent: Verbal consent obtained. Risks and benefits: risks, benefits and alternatives were discussed Patient identity confirmed: provided demographic data Time out performed prior to procedure Prepped and Draped in normal sterile fashion Wound explored Laceration Location: tip  Laceration Length: 1 cm (flap) No Foreign Bodies seen or palpated Anesthesia: local  infiltration Local anesthetic: lidocaine 2% w/o epinephrine Anesthetic total: 1 ml Irrigation method: syringe Amount of cleaning: standard Skin closure: 5-0 Prolene  Number of sutures or staples: 3  Technique: Simple interrupted  Patient tolerance: Patient tolerated the procedure well with no immediate complications.   ED DIAGNOSES     ICD-9-CM ICD-10-CM   1. Laceration of left index finger without foreign body without damage to nail, initial encounter 883.0 S61.211A     I personally performed the services described in this documentation, which was scribed in my presence. The recorded information has been reviewed and is accurate.     Paula Libra, MD 03/28/16 [phone]

## 2016-03-27 NOTE — ED Triage Notes (Signed)
Pt states cut the tip of rt 1st finger with a knife

## 2016-09-19 ENCOUNTER — Encounter (HOSPITAL_COMMUNITY): Payer: Self-pay | Admitting: *Deleted

## 2016-09-24 ENCOUNTER — Ambulatory Visit (HOSPITAL_COMMUNITY)
Admission: RE | Admit: 2016-09-24 | Discharge: 2016-09-24 | Disposition: A | Discharge status: Home / Self Care | Payer: 59 | Source: Ambulatory Visit | Attending: Obstetrics and Gynecology | Admitting: Obstetrics and Gynecology

## 2016-09-24 ENCOUNTER — Ambulatory Visit (HOSPITAL_COMMUNITY): Payer: 59 | Admitting: Certified Registered Nurse Anesthetist

## 2016-09-24 ENCOUNTER — Ambulatory Visit (HOSPITAL_COMMUNITY): Payer: 59

## 2016-09-24 ENCOUNTER — Encounter (HOSPITAL_COMMUNITY)
Admission: RE | Disposition: A | Discharge status: Home / Self Care | Payer: Self-pay | Source: Ambulatory Visit | Attending: Obstetrics and Gynecology

## 2016-09-24 DIAGNOSIS — O021 Missed abortion: Secondary | ICD-10-CM | POA: Insufficient documentation

## 2016-09-24 DIAGNOSIS — D696 Thrombocytopenia, unspecified: Secondary | ICD-10-CM | POA: Diagnosis not present

## 2016-09-24 HISTORY — DX: Anemia, unspecified: D64.9

## 2016-09-24 HISTORY — PX: DILATION AND EVACUATION: SHX1459

## 2016-09-24 LAB — CBC
HCT: 34.1 % — ABNORMAL LOW (ref 36.0–46.0)
HEMOGLOBIN: 11.7 g/dL — AB (ref 12.0–15.0)
MCH: 30.2 pg (ref 26.0–34.0)
MCHC: 34.3 g/dL (ref 30.0–36.0)
MCV: 87.9 fL (ref 78.0–100.0)
PLATELETS: 141 10*3/uL — AB (ref 150–400)
RBC: 3.88 MIL/uL (ref 3.87–5.11)
RDW: 12.7 % (ref 11.5–15.5)
WBC: 3.4 10*3/uL — ABNORMAL LOW (ref 4.0–10.5)

## 2016-09-24 LAB — TYPE AND SCREEN
ABO/RH(D): A NEG
Antibody Screen: NEGATIVE

## 2016-09-24 SURGERY — DILATION AND EVACUATION, UTERUS
Anesthesia: Monitor Anesthesia Care | Site: Uterus

## 2016-09-24 MED ORDER — ONDANSETRON HCL 4 MG/2ML IJ SOLN
INTRAMUSCULAR | Status: AC
  Filled 2016-09-24: qty 2

## 2016-09-24 MED ORDER — FENTANYL CITRATE (PF) 100 MCG/2ML IJ SOLN: 25.0000 ug | INTRAMUSCULAR | Status: DC | PRN

## 2016-09-24 MED ORDER — FENTANYL CITRATE (PF) 100 MCG/2ML IJ SOLN
INTRAMUSCULAR | Status: DC | PRN
  Administered 2016-09-24: 100 ug via INTRAVENOUS

## 2016-09-24 MED ORDER — DEXAMETHASONE SODIUM PHOSPHATE 10 MG/ML IJ SOLN
INTRAMUSCULAR | Status: DC | PRN
  Administered 2016-09-24: 10 mg via INTRAVENOUS

## 2016-09-24 MED ORDER — PROPOFOL 500 MG/50ML IV EMUL
INTRAVENOUS | Status: DC | PRN
  Administered 2016-09-24: 50 mg via INTRAVENOUS
  Administered 2016-09-24 (×2): 10 mg via INTRAVENOUS

## 2016-09-24 MED ORDER — DEXAMETHASONE SODIUM PHOSPHATE 4 MG/ML IJ SOLN
INTRAMUSCULAR | Status: AC
  Filled 2016-09-24: qty 1

## 2016-09-24 MED ORDER — EPHEDRINE SULFATE 50 MG/ML IJ SOLN
INTRAMUSCULAR | Status: DC | PRN
  Administered 2016-09-24: 5 mg via INTRAVENOUS

## 2016-09-24 MED ORDER — MIDAZOLAM HCL 2 MG/2ML IJ SOLN
INTRAMUSCULAR | Status: AC
  Filled 2016-09-24: qty 2

## 2016-09-24 MED ORDER — RHO D IMMUNE GLOBULIN 1500 UNIT/2ML IJ SOSY
300.0000 ug | PREFILLED_SYRINGE | Freq: Once | INTRAMUSCULAR | Status: AC
  Administered 2016-09-24: 300 ug via INTRAMUSCULAR
  Filled 2016-09-24: qty 2

## 2016-09-24 MED ORDER — MIDAZOLAM HCL 2 MG/2ML IJ SOLN
INTRAMUSCULAR | Status: DC | PRN
  Administered 2016-09-24: 2 mg via INTRAVENOUS

## 2016-09-24 MED ORDER — PROMETHAZINE HCL 25 MG/ML IJ SOLN: 6.2500 mg | INTRAMUSCULAR | Status: DC | PRN

## 2016-09-24 MED ORDER — DEXAMETHASONE SODIUM PHOSPHATE 10 MG/ML IJ SOLN
INTRAMUSCULAR | Status: AC
  Filled 2016-09-24: qty 1

## 2016-09-24 MED ORDER — KETOROLAC TROMETHAMINE 30 MG/ML IJ SOLN
INTRAMUSCULAR | Status: DC | PRN
  Administered 2016-09-24: 30 mg via INTRAVENOUS

## 2016-09-24 MED ORDER — PROPOFOL 500 MG/50ML IV EMUL
INTRAVENOUS | Status: DC | PRN
  Administered 2016-09-24: 50 ug/kg/min via INTRAVENOUS

## 2016-09-24 MED ORDER — LIDOCAINE HCL 1 % IJ SOLN
INTRAMUSCULAR | Status: AC
  Filled 2016-09-24: qty 20

## 2016-09-24 MED ORDER — PROPOFOL 10 MG/ML IV BOLUS
INTRAVENOUS | Status: AC
  Filled 2016-09-24: qty 20

## 2016-09-24 MED ORDER — KETOROLAC TROMETHAMINE 30 MG/ML IJ SOLN
INTRAMUSCULAR | Status: AC
  Filled 2016-09-24: qty 1

## 2016-09-24 MED ORDER — LACTATED RINGERS IV SOLN
INTRAVENOUS | Status: DC
  Administered 2016-09-24: 07:00:00 via INTRAVENOUS

## 2016-09-24 MED ORDER — LACTATED RINGERS IV SOLN: INTRAVENOUS | Status: DC

## 2016-09-24 MED ORDER — LIDOCAINE HCL (CARDIAC) 20 MG/ML IV SOLN
INTRAVENOUS | Status: DC | PRN
  Administered 2016-09-24: 80 mg via INTRAVENOUS

## 2016-09-24 MED ORDER — FENTANYL CITRATE (PF) 100 MCG/2ML IJ SOLN
INTRAMUSCULAR | Status: AC
  Filled 2016-09-24: qty 2

## 2016-09-24 MED ORDER — LIDOCAINE HCL (CARDIAC) 20 MG/ML IV SOLN
INTRAVENOUS | Status: AC
  Filled 2016-09-24: qty 5

## 2016-09-24 MED ORDER — LIDOCAINE HCL 1 % IJ SOLN
INTRAMUSCULAR | Status: DC | PRN
  Administered 2016-09-24: 10 mL

## 2016-09-24 MED ORDER — CEFAZOLIN SODIUM-DEXTROSE 2-4 GM/100ML-% IV SOLN
2.0000 g | INTRAVENOUS | Status: AC
  Administered 2016-09-24: 2 g via INTRAVENOUS

## 2016-09-24 MED ORDER — ONDANSETRON HCL 4 MG/2ML IJ SOLN
INTRAMUSCULAR | Status: DC | PRN
  Administered 2016-09-24: 4 mg via INTRAVENOUS

## 2016-09-24 SURGICAL SUPPLY — 20 items
CATH ROBINSON RED A/P 16FR (CATHETERS) ×3 IMPLANT
CATH SILICONE 16FRX5CC (CATHETERS) ×2 IMPLANT
CLOTH BEACON ORANGE TIMEOUT ST (SAFETY) ×3 IMPLANT
DECANTER SPIKE VIAL GLASS SM (MISCELLANEOUS) ×3 IMPLANT
GLOVE BIO SURGEON STRL SZ 6.5 (GLOVE) ×4 IMPLANT
GLOVE BIO SURGEONS STRL SZ 6.5 (GLOVE) ×2
GLOVE BIOGEL PI IND STRL 7.0 (GLOVE) ×1 IMPLANT
GLOVE BIOGEL PI INDICATOR 7.0 (GLOVE) ×2
GOWN STRL REUS W/TWL LRG LVL3 (GOWN DISPOSABLE) ×6 IMPLANT
KIT BERKELEY 1ST TRIMESTER 3/8 (MISCELLANEOUS) ×3 IMPLANT
NS IRRIG 1000ML POUR BTL (IV SOLUTION) ×3 IMPLANT
PACK VAGINAL MINOR WOMEN LF (CUSTOM PROCEDURE TRAY) ×3 IMPLANT
PAD OB MATERNITY 4.3X12.25 (PERSONAL CARE ITEMS) ×3 IMPLANT
PAD PREP 24X48 CUFFED NSTRL (MISCELLANEOUS) ×3 IMPLANT
SET BERKELEY SUCTION TUBING (SUCTIONS) ×3 IMPLANT
TOWEL OR 17X24 6PK STRL BLUE (TOWEL DISPOSABLE) ×6 IMPLANT
VACURETTE 10 RIGID CVD (CANNULA) IMPLANT
VACURETTE 7MM CVD STRL WRAP (CANNULA) ×2 IMPLANT
VACURETTE 8 RIGID CVD (CANNULA) IMPLANT
VACURETTE 9 RIGID CVD (CANNULA) IMPLANT

## 2016-09-24 NOTE — H&P (Signed)
36 year old G 3 P 2 with missed ab.  RH -  Past Medical History:  Diagnosis Date  . Anemia    WITH PREGNANCY  . Heart murmur    AS A CHILD  . Hemorrhage after delivery of fetus 01/2013  . History of cryosurgery   . Medical history non-contributory   . Urinary tract infection    Past Surgical History:  Procedure Laterality Date  . GYNECOLOGIC CRYOSURGERY  2012  . WISDOM TOOTH EXTRACTION  2006   Latex  Prior to Admission medications   Medication Sig Start Date End Date Taking? Authorizing Provider  Fe Cbn-Fe Gluc-FA-B12-C-DSS (FERRALET 90 PO) Take 1 tablet by mouth daily.    Yes Historical Provider, MD  Prenatal Vit-Fe Fumarate-FA (PRENATAL MULTIVITAMIN) TABS tablet Take 1 tablet by mouth daily at 12 noon.   Yes Historical Provider, MD   Family History  Problem Relation Age of Onset  . Cancer Father    Social History   Social History  . Marital status: Married    Spouse name: N/A  . Number of children: N/A  . Years of education: N/A   Social History Main Topics  . Smoking status: Never Smoker  . Smokeless tobacco: Never Used  . Alcohol use 0.6 oz/week    1 Glasses of wine per week     Comment: prior to pregnancy  . Drug use: No  . Sexual activity: Yes   Other Topics Concern  . None   Social History Narrative  . None   BP 115/88   Pulse 81   Temp 98.1 F (36.7 C) (Oral)   Resp 16   Ht  (1.6 m)   Wt 44.5 kg (98 lb)   SpO2 100%   BMI 17.36 kg/m  Results for orders placed or performed during the hospital encounter of 09/24/16 (from the past 24 hour(s))  CBC     Status: Abnormal   Collection Time: 09/24/16  6:13 AM  Result Value Ref Range   WBC 3.4 (L) 4.0 - 10.5 K/uL   RBC 3.88 3.87 - 5.11 MIL/uL   Hemoglobin 11.7 (L) 12.0 - 15.0 g/dL   HCT 95.6 (L) 21.3 - 08.6 %   MCV 87.9 78.0 - 100.0 fL   MCH 30.2 26.0 - 34.0 pg   MCHC 34.3 30.0 - 36.0 g/dL   RDW 57.8 46.9 - 62.9 %   Platelets 141 (L) 150 - 400 K/uL  Type and screen [facility]  [place]     Status: None   Collection Time: 09/24/16  6:13 AM  Result Value Ref Range   ABO/RH(D) A NEG    Antibody Screen NEG    Sample Expiration 09/27/2016    General alert and oriented Lung CTAB Car RRR Abdomen is soft and non tender  Pelvic is unremarkable  IMPRESSION: Missed Ab Rh -  PLAN: D and E with ultrasound guidance Rhogam

## 2016-09-24 NOTE — Transfer of Care (Signed)
Immediate Anesthesia Transfer of Care Note  Patient: Desiree Garza  Procedure(s) Performed: Procedure(s): DILATATION AND EVACUATION with ultrasound guidance (N/A)  Patient Location: PACU  Anesthesia Type:MAC  Level of Consciousness: awake, alert  and oriented  Airway & Oxygen Therapy: Patient Spontanous Breathing and Patient connected to nasal cannula oxygen  Post-op Assessment: Report given to RN, Post -op Vital signs reviewed and stable and Patient moving all extremities  Post vital signs: Reviewed and stable  Last Vitals:  Vitals:   09/24/16 0611  BP: 115/88  Pulse: 81  Resp: 16  Temp: 36.7 C    Last Pain:  Vitals:   09/24/16 0611  TempSrc: Oral      Patients Stated Pain Goal: 4 (50/93/26 7124)  Complications: No apparent anesthesia complications

## 2016-09-24 NOTE — Discharge Instructions (Signed)
DISCHARGE INSTRUCTIONS: D&C / D&E The following instructions have been prepared to help you care for yourself upon your return home.   Personal hygiene:  Use sanitary pads for vaginal drainage, not tampons.  Shower the day after your procedure.  NO tub baths, pools or Jacuzzis for 2-3 weeks.  Wipe front to back after using the bathroom.  Activity and limitations:  Do NOT drive or operate any equipment for 24 hours. The effects of anesthesia are still present and drowsiness may result.  Do NOT rest in bed all day.  Walking is encouraged.  Walk up and down stairs slowly.  You may resume your normal activity in one to two days or as indicated by your physician.  Sexual activity: NO intercourse for at least 2 weeks after the procedure, or as indicated by your physician.  Diet: Eat a light meal as desired this evening. You may resume your usual diet tomorrow.  Return to work: You may resume your work activities in one to two days or as indicated by your doctor.  What to expect after your surgery: Expect to have vaginal bleeding/discharge for 2-3 days and spotting for up to 10 days. It is not unusual to have soreness for up to 1-2 weeks. You may have a slight burning sensation when you urinate for the first day. Mild cramps may continue for a couple of days. You may have a regular period in 2-6 weeks.  Call your doctor for any of the following:  Excessive vaginal bleeding, saturating and changing one pad every hour.  Inability to urinate 6 hours after discharge from hospital.  Pain not relieved by pain medication.  Fever of 100.4 F or greater.  Unusual vaginal discharge or odor.   Call for an appointment:    Patients signature: ______________________  Nurses signature ________________________  Support person's signature_______________________    NO IBUPROFEN PRODUCTS (MOTRIN, ADVIL) OR ALEVE UNTIL 1:45 PM TODAY.   Post Anesthesia Home Care  Instructions  Activity: Get plenty of rest for the remainder of the day. A responsible individual must stay with you for 24 hours following the procedure.  For the next 24 hours, DO NOT: -Drive a car -Advertising copywriter -Drink alcoholic beverages -Take any medication unless instructed by your physician -Make any legal decisions or sign important papers.  Meals: Start with liquid foods such as gelatin or soup. Progress to regular foods as tolerated. Avoid greasy, spicy, heavy foods. If nausea and/or vomiting occur, drink only clear liquids until the nausea and/or vomiting subsides. Call your physician if vomiting continues.  Special Instructions/Symptoms: Your throat may feel dry or sore from the anesthesia or the breathing tube placed in your throat during surgery. If this causes discomfort, gargle with warm salt water. The discomfort should disappear within 24 hours.  If you had a scopolamine patch placed behind your ear for the management of post- operative nausea and/or vomiting:  1. The medication in the patch is effective for 72 hours, after which it should be removed.  Wrap patch in a tissue and discard in the trash. Wash hands thoroughly with soap and water. 2. You may remove the patch earlier than 72 hours if you experience unpleasant side effects which may include dry mouth, dizziness or visual disturbances. 3. Avoid touching the patch. Wash your hands with soap and water after contact with the patch.

## 2016-09-24 NOTE — Anesthesia Postprocedure Evaluation (Signed)
Anesthesia Post Note  Patient: Desiree Garza  Procedure(s) Performed: Procedure(s) (LRB): DILATATION AND EVACUATION with ultrasound guidance (N/A)  Patient location during evaluation: PACU Anesthesia Type: MAC Level of consciousness: awake and alert Pain management: pain level controlled Vital Signs Assessment: post-procedure vital signs reviewed and stable Respiratory status: spontaneous breathing and respiratory function stable Cardiovascular status: stable Anesthetic complications: no        Last Vitals:  Vitals:   09/24/16 0900 09/24/16 0915  BP: 106/68   Pulse: 69   Resp: 16 16  Temp:  37.1 C    Last Pain:  Vitals:   09/24/16 0830  TempSrc:   PainSc: 0-No pain   Pain Goal: Patients Stated Pain Goal: 4 (09/24/16 0815)               [place]

## 2016-09-24 NOTE — Anesthesia Preprocedure Evaluation (Addendum)
Anesthesia Evaluation  Patient identified by MRN, date of birth, ID band Patient awake    Reviewed: Allergy & Precautions, NPO status , Patient's Chart, lab work & pertinent test results  Airway Mallampati: II  TM Distance: >3 FB Neck ROM: Full    Dental  (+) Teeth Intact, Dental Advisory Given   Pulmonary neg pulmonary ROS,    Pulmonary exam normal breath sounds clear to auscultation       Cardiovascular negative cardio ROS Normal cardiovascular exam Rhythm:Regular Rate:Normal     Neuro/Psych negative neurological ROS  negative psych ROS   GI/Hepatic negative GI ROS, Neg liver ROS,   Endo/Other  negative endocrine ROS  Renal/GU negative Renal ROS     Musculoskeletal negative musculoskeletal ROS (+)   Abdominal   Peds  Hematology  (+) Blood dyscrasia (thrombocytopenia), anemia ,   Anesthesia Other Findings Day of surgery medications reviewed with the patient.  Reproductive/Obstetrics 9wk missed abortion                            Anesthesia Physical Anesthesia Plan  ASA: II  Anesthesia Plan: MAC   Post-op Pain Management:    Induction: Intravenous  Airway Management Planned: Nasal Cannula  Additional Equipment:   Intra-op Plan:   Post-operative Plan:   Informed Consent: I have reviewed the patients History and Physical, chart, labs and discussed the procedure including the risks, benefits and alternatives for the proposed anesthesia with the patient or authorized representative who has indicated his/her understanding and acceptance.   Dental advisory given  Plan Discussed with: CRNA  Anesthesia Plan Comments: (Discussed risks/benefits/alternatives to MAC sedation including need for ventilatory support, hypotension, need for conversion to general anesthesia.  All patient questions answered.  Patient/guardian wishes to proceed.)       Anesthesia Quick Evaluation

## 2016-09-24 NOTE — Brief Op Note (Signed)
09/24/2016  7:54 AM  PATIENT:  Desiree Garza  36 y.o. female  PRE-OPERATIVE DIAGNOSIS:   Missed Abortion Rh negative  POST-OPERATIVE DIAGNOSIS:   same   PROCEDURE:  Procedure(s): DILATATION AND EVACUATION with ultrasound guidance (N/A)  SURGEON:  Surgeon(s) and Role:    * Dian Queen, MD - Primary  PHYSICIAN ASSISTANT:   ASSISTANTS: none   ANESTHESIA:   paracervical block and MAC  EBL:  Total I/O In: 500 [I.V.:500] Out: 200 [Urine:200]  BLOOD ADMINISTERED:none  DRAINS: none   LOCAL MEDICATIONS USED:  LIDOCAINE   SPECIMEN:  Source of Specimen:  POC  DISPOSITION OF SPECIMEN:  PATHOLOGY  COUNTS:  YES  TOURNIQUET:  * No tourniquets in log *  DICTATION: .Other Dictation: Dictation Number [id number]  PLAN OF CARE: Discharge to home after PACU  PATIENT DISPOSITION:  PACU - hemodynamically stable.   Delay start of Pharmacological VTE agent (>24hrs) due to surgical blood loss or risk of bleeding: not applicable

## 2016-09-24 NOTE — Op Note (Signed)
[name], Desiree Garza                  ACCOUNT NO.:  [id number]  MEDICAL RECORD NO.:  [id number]  LOCATION:                                 FACILITY:  PHYSICIAN:  [place] Levia Waltermire, M.D.DATE OF BIRTH:  [birth year]  DATE OF PROCEDURE: DATE OF DISCHARGE:                              OPERATIVE REPORT   PREOPERATIVE DIAGNOSIS:  Missed abortion, Rh negative.  POSTOPERATIVE DIAGNOSIS:  Missed abortion, Rh negative.  PROCEDURE:  D and E with ultrasound guidance and RhoGAM.  SURGEON:  Noga Fogg L. Helane Rima, M.D.  ANESTHESIA:  MAC with paracervical.  ESTIMATED BLOOD LOSS:  Minimal.  PATHOLOGY:  Products of conception sent to Pathology.  DESCRIPTION OF PROCEDURE:  The patient was taken to the operating room. She was then administered anesthesia.  She was prepped and draped in the usual sterile fashion.  In-and-out catheter was used to empty the bladder.  A speculum was inserted.  The cervix was grasped with a tenaculum.  A paracervical block was performed.  The ultrasound technician performed an ultrasound peri-procedure and she noted the nonviable pregnancy.  I then used Pratt dilators to gently dilate the cervix with ultrasound guidance.  I then inserted a #7 suction cannula and confirmed correct placement and then performed a suction curettage. Most of the tissue came out with the first pass.  I then inserted the sharp curette and the uterus was thoroughly curetted of all tissue. Final suction curettage was performed.  At the end of the procedure, uterine cavity appeared to be clean.  All sponge, lap, and instrument counts were correct x2.  The patient went to recovery room in stable condition.  She will be administered RhoGAM in the recovery room.     [place] Foerster L. Helane Rima, M.D.     Nevin Bloodgood  D:  09/24/2016  T:  09/24/2016  Job:  [id number]

## 2016-09-25 ENCOUNTER — Encounter (HOSPITAL_COMMUNITY): Payer: Self-pay | Admitting: Obstetrics and Gynecology

## 2016-09-25 LAB — RH IG WORKUP (INCLUDES ABO/RH)
ABO/RH(D): A NEG
GESTATIONAL AGE(WKS): 8
Unit division: 0

## 2016-11-01 ENCOUNTER — Encounter (HOSPITAL_COMMUNITY): Payer: Self-pay | Admitting: *Deleted

## 2016-11-05 ENCOUNTER — Encounter (HOSPITAL_COMMUNITY)
Admission: RE | Disposition: A | Discharge status: Home / Self Care | Payer: Self-pay | Source: Ambulatory Visit | Attending: Obstetrics and Gynecology

## 2016-11-05 ENCOUNTER — Ambulatory Visit (HOSPITAL_COMMUNITY): Payer: 59

## 2016-11-05 ENCOUNTER — Ambulatory Visit (HOSPITAL_COMMUNITY)
Admission: RE | Admit: 2016-11-05 | Discharge: 2016-11-05 | Disposition: A | Discharge status: Home / Self Care | Payer: 59 | Source: Ambulatory Visit | Attending: Obstetrics and Gynecology | Admitting: Obstetrics and Gynecology

## 2016-11-05 ENCOUNTER — Ambulatory Visit (HOSPITAL_COMMUNITY): Payer: 59 | Admitting: Anesthesiology

## 2016-11-05 ENCOUNTER — Encounter (HOSPITAL_COMMUNITY): Payer: Self-pay | Admitting: *Deleted

## 2016-11-05 DIAGNOSIS — IMO0002 Reserved for concepts with insufficient information to code with codable children: Secondary | ICD-10-CM

## 2016-11-05 DIAGNOSIS — O029 Abnormal product of conception, unspecified: Secondary | ICD-10-CM

## 2016-11-05 DIAGNOSIS — N854 Malposition of uterus: Secondary | ICD-10-CM | POA: Insufficient documentation

## 2016-11-05 DIAGNOSIS — O021 Missed abortion: Secondary | ICD-10-CM | POA: Insufficient documentation

## 2016-11-05 DIAGNOSIS — Z3A01 Less than 8 weeks gestation of pregnancy: Secondary | ICD-10-CM | POA: Insufficient documentation

## 2016-11-05 DIAGNOSIS — Z91040 Latex allergy status: Secondary | ICD-10-CM | POA: Insufficient documentation

## 2016-11-05 HISTORY — PX: DILATION AND EVACUATION: SHX1459

## 2016-11-05 HISTORY — DX: Nausea with vomiting, unspecified: R11.2

## 2016-11-05 HISTORY — DX: Other specified postprocedural states: Z98.890

## 2016-11-05 LAB — CBC
HEMATOCRIT: 35.9 % — AB (ref 36.0–46.0)
HEMOGLOBIN: 12.3 g/dL (ref 12.0–15.0)
MCH: 30.5 pg (ref 26.0–34.0)
MCHC: 34.3 g/dL (ref 30.0–36.0)
MCV: 89.1 fL (ref 78.0–100.0)
Platelets: 189 10*3/uL (ref 150–400)
RBC: 4.03 MIL/uL (ref 3.87–5.11)
RDW: 13.1 % (ref 11.5–15.5)
WBC: 4.5 10*3/uL (ref 4.0–10.5)

## 2016-11-05 SURGERY — DILATION AND EVACUATION, UTERUS
Anesthesia: Monitor Anesthesia Care | Site: Vagina

## 2016-11-05 MED ORDER — FENTANYL CITRATE (PF) 100 MCG/2ML IJ SOLN
INTRAMUSCULAR | Status: DC | PRN
  Administered 2016-11-05 (×2): 50 ug via INTRAVENOUS

## 2016-11-05 MED ORDER — KETOROLAC TROMETHAMINE 30 MG/ML IJ SOLN
INTRAMUSCULAR | Status: DC | PRN
  Administered 2016-11-05: 30 mg via INTRAVENOUS

## 2016-11-05 MED ORDER — LIDOCAINE HCL 1 % IJ SOLN
INTRAMUSCULAR | Status: AC
  Filled 2016-11-05: qty 20

## 2016-11-05 MED ORDER — LIDOCAINE HCL (CARDIAC) 20 MG/ML IV SOLN
INTRAVENOUS | Status: DC | PRN
  Administered 2016-11-05: 50 mg via INTRAVENOUS

## 2016-11-05 MED ORDER — DEXAMETHASONE SODIUM PHOSPHATE 4 MG/ML IJ SOLN
INTRAMUSCULAR | Status: AC
  Filled 2016-11-05: qty 1

## 2016-11-05 MED ORDER — LIDOCAINE HCL 1 % IJ SOLN
INTRAMUSCULAR | Status: DC | PRN
  Administered 2016-11-05: 10 mL

## 2016-11-05 MED ORDER — CEFOTETAN DISODIUM-DEXTROSE 2-2.08 GM-% IV SOLR
INTRAVENOUS | Status: AC
  Filled 2016-11-05: qty 50

## 2016-11-05 MED ORDER — LACTATED RINGERS IV SOLN
INTRAVENOUS | Status: DC
  Administered 2016-11-05 (×2): via INTRAVENOUS

## 2016-11-05 MED ORDER — ONDANSETRON HCL 4 MG/2ML IJ SOLN
INTRAMUSCULAR | Status: AC
Start: 2016-11-05 — End: 2016-11-05
  Filled 2016-11-05: qty 2

## 2016-11-05 MED ORDER — DEXAMETHASONE SODIUM PHOSPHATE 10 MG/ML IJ SOLN
INTRAMUSCULAR | Status: DC | PRN
  Administered 2016-11-05: 4 mg via INTRAVENOUS

## 2016-11-05 MED ORDER — SCOPOLAMINE 1 MG/3DAYS TD PT72
MEDICATED_PATCH | TRANSDERMAL | Status: AC
  Filled 2016-11-05: qty 1

## 2016-11-05 MED ORDER — MIDAZOLAM HCL 2 MG/2ML IJ SOLN
INTRAMUSCULAR | Status: AC
  Filled 2016-11-05: qty 2

## 2016-11-05 MED ORDER — PROPOFOL 10 MG/ML IV BOLUS
INTRAVENOUS | Status: DC | PRN
  Administered 2016-11-05: 50 mg via INTRAVENOUS
  Administered 2016-11-05: 30 mg via INTRAVENOUS
  Administered 2016-11-05: 20 mg via INTRAVENOUS
  Administered 2016-11-05: 30 mg via INTRAVENOUS
  Administered 2016-11-05: 50 mg via INTRAVENOUS
  Administered 2016-11-05: 20 mg via INTRAVENOUS

## 2016-11-05 MED ORDER — PROPOFOL 10 MG/ML IV BOLUS
INTRAVENOUS | Status: AC
  Filled 2016-11-05: qty 20

## 2016-11-05 MED ORDER — SCOPOLAMINE 1 MG/3DAYS TD PT72: 1.0000 | MEDICATED_PATCH | Freq: Once | TRANSDERMAL | Status: DC

## 2016-11-05 MED ORDER — MIDAZOLAM HCL 2 MG/2ML IJ SOLN
INTRAMUSCULAR | Status: DC | PRN
  Administered 2016-11-05: 2 mg via INTRAVENOUS

## 2016-11-05 MED ORDER — FENTANYL CITRATE (PF) 100 MCG/2ML IJ SOLN
INTRAMUSCULAR | Status: AC
  Filled 2016-11-05: qty 2

## 2016-11-05 MED ORDER — LACTATED RINGERS IV SOLN
INTRAVENOUS | Status: DC
  Administered 2016-11-05: 125 mL/h via INTRAVENOUS

## 2016-11-05 MED ORDER — CEFOTETAN DISODIUM-DEXTROSE 2-2.08 GM-% IV SOLR
2.0000 g | INTRAVENOUS | Status: AC
  Administered 2016-11-05: 2 g via INTRAVENOUS

## 2016-11-05 MED ORDER — ONDANSETRON HCL 4 MG/2ML IJ SOLN
INTRAMUSCULAR | Status: DC | PRN
  Administered 2016-11-05: 4 mg via INTRAVENOUS

## 2016-11-05 SURGICAL SUPPLY — 19 items
CATH ROBINSON RED A/P 16FR (CATHETERS) ×3 IMPLANT
CLOTH BEACON ORANGE TIMEOUT ST (SAFETY) ×3 IMPLANT
DECANTER SPIKE VIAL GLASS SM (MISCELLANEOUS) ×3 IMPLANT
GLOVE BIO SURGEON STRL SZ 6.5 (GLOVE) ×4 IMPLANT
GLOVE BIO SURGEONS STRL SZ 6.5 (GLOVE) ×2
GLOVE BIOGEL PI IND STRL 7.0 (GLOVE) ×1 IMPLANT
GLOVE BIOGEL PI INDICATOR 7.0 (GLOVE) ×2
GOWN STRL REUS W/TWL LRG LVL3 (GOWN DISPOSABLE) ×6 IMPLANT
KIT BERKELEY 1ST TRIMESTER 3/8 (MISCELLANEOUS) ×3 IMPLANT
NS IRRIG 1000ML POUR BTL (IV SOLUTION) ×3 IMPLANT
PACK VAGINAL MINOR WOMEN LF (CUSTOM PROCEDURE TRAY) ×3 IMPLANT
PAD OB MATERNITY 4.3X12.25 (PERSONAL CARE ITEMS) ×3 IMPLANT
PAD PREP 24X48 CUFFED NSTRL (MISCELLANEOUS) ×3 IMPLANT
SET BERKELEY SUCTION TUBING (SUCTIONS) ×3 IMPLANT
TOWEL OR 17X24 6PK STRL BLUE (TOWEL DISPOSABLE) ×6 IMPLANT
VACURETTE 10 RIGID CVD (CANNULA) IMPLANT
VACURETTE 7MM CVD STRL WRAP (CANNULA) IMPLANT
VACURETTE 8 RIGID CVD (CANNULA) IMPLANT
VACURETTE 9 RIGID CVD (CANNULA) IMPLANT

## 2016-11-05 NOTE — Progress Notes (Signed)
H and P on the chart No changes Will proceed with D and E with ultrasoune Consent signed

## 2016-11-05 NOTE — Brief Op Note (Signed)
11/05/2016  2:19 PM  PATIENT:  [name]  36 y.o. female  PRE-OPERATIVE DIAGNOSIS:  retained products after miscarriage  POST-OPERATIVE DIAGNOSIS:  retained products after miscarriage  PROCEDURE:  Procedure(s): DILATATION AND EVACUATION with ultrasound guidance (N/A)  SURGEON:  Surgeon(s) and Role:    * Dian Queen, MD - Primary  PHYSICIAN ASSISTANT:   ASSISTANTS: none   ANESTHESIA:   paracervical block and MAC  EBL:  Total I/O In: 1000 [I.V.:1000] Out: 260 [Urine:250; Blood:10]  BLOOD ADMINISTERED:none  DRAINS: none   LOCAL MEDICATIONS USED:  LIDOCAINE   SPECIMEN:  Source of Specimen:  POC  DISPOSITION OF SPECIMEN:  PATHOLOGY  COUNTS:  YES  TOURNIQUET:  * No tourniquets in log *  DICTATION: .Other Dictation: Dictation Number [id number]  PLAN OF CARE: Discharge to home after PACU  PATIENT DISPOSITION:  PACU - hemodynamically stable.   Delay start of Pharmacological VTE agent (>24hrs) due to surgical blood loss or risk of bleeding: not applicable

## 2016-11-05 NOTE — Discharge Instructions (Addendum)
DISCHARGE INSTRUCTIONS: D&C / D&E The following instructions have been prepared to help you care for yourself upon your return home.   Personal hygiene:  Use sanitary pads for vaginal drainage, not tampons.  Shower the day after your procedure.  NO tub baths, pools or Jacuzzis for 2-3 weeks.  Wipe front to back after using the bathroom.  Activity and limitations:  Do NOT drive or operate any equipment for 24 hours. The effects of anesthesia are still present and drowsiness may result.  Do NOT rest in bed all day.  Walking is encouraged.  Walk up and down stairs slowly.  You may resume your normal activity in one to two days or as indicated by your physician.  Sexual activity: NO intercourse for at least 2 weeks after the procedure, or as indicated by your physician.  Diet: Eat a light meal as desired this evening. You may resume your usual diet tomorrow.  Return to work: You may resume your work activities in one to two days or as indicated by your doctor.  What to expect after your surgery: Expect to have vaginal bleeding/discharge for 2-3 days and spotting for up to 10 days. It is not unusual to have soreness for up to 1-2 weeks. You may have a slight burning sensation when you urinate for the first day. Mild cramps may continue for a couple of days. You may have a regular period in 2-6 weeks.  Call your doctor for any of the following:  Excessive vaginal bleeding, saturating and changing one pad every hour.  Inability to urinate 6 hours after discharge from hospital.  Pain not relieved by pain medication.  Fever of 100.4 F or greater.  Unusual vaginal discharge or odor.   Call for an appointment:    Patients signature: ______________________  Nurses signature ________________________  Support person's signature_______________________   Post Anesthesia Home Care Instructions  NO IBUPROFEN PRODUCTS UNTIL: 8:15 PM TONIGHT  Activity: Get plenty of rest  for the remainder of the day. A responsible individual must stay with you for 24 hours following the procedure.  For the next 24 hours, DO NOT: -Drive a car -Advertising copywriterperate machinery -Drink alcoholic beverages -Take any medication unless instructed by your physician -Make any legal decisions or sign important papers.  Meals: Start with liquid foods such as gelatin or soup. Progress to regular foods as tolerated. Avoid greasy, spicy, heavy foods. If nausea and/or vomiting occur, drink only clear liquids until the nausea and/or vomiting subsides. Call your physician if vomiting continues.  Special Instructions/Symptoms: Your throat may feel dry or sore from the anesthesia or the breathing tube placed in your throat during surgery. If this causes discomfort, gargle with warm salt water. The discomfort should disappear within 24 hours.  If you had a scopolamine patch placed behind your ear for the management of post- operative nausea and/or vomiting:  1. The medication in the patch is effective for 72 hours, after which it should be removed.  Wrap patch in a tissue and discard in the trash. Wash hands thoroughly with soap and water. 2. You may remove the patch earlier than 72 hours if you experience unpleasant side effects which may include dry mouth, dizziness or visual disturbances. 3. Avoid touching the patch. Wash your hands with soap and water after contact with the patch.

## 2016-11-05 NOTE — Anesthesia Postprocedure Evaluation (Signed)
Anesthesia Post Note  Patient: Desiree Garza  Procedure(s) Performed: Procedure(s) (LRB): DILATATION AND EVACUATION with ultrasound guidance (N/A)  Patient location during evaluation: PACU Anesthesia Type: MAC Level of consciousness: awake and alert, oriented and patient cooperative Pain management: pain level controlled Vital Signs Assessment: post-procedure vital signs reviewed and stable Respiratory status: spontaneous breathing, nonlabored ventilation and respiratory function stable Cardiovascular status: blood pressure returned to baseline and stable Postop Assessment: no signs of nausea or vomiting Anesthetic complications: no        Last Vitals:  Vitals:   11/05/16 1232 11/05/16 1427  BP: 123/81 100/74  Pulse: 71 79  Resp: 18 16  Temp: 36.9 C 36.4 C    Last Pain:  Vitals:   11/05/16 1232  TempSrc: Oral   Pain Goal: Patients Stated Pain Goal: 3 (11/05/16 1427)               Seleta Rhymes. Varina Hulon

## 2016-11-05 NOTE — Transfer of Care (Signed)
Immediate Anesthesia Transfer of Care Note  Patient: Desiree Garza  Procedure(s) Performed: Procedure(s): DILATATION AND EVACUATION with ultrasound guidance (N/A)  Patient Location: PACU  Anesthesia Type:MAC  Level of Consciousness: awake and alert   Airway & Oxygen Therapy: Patient Spontanous Breathing and Patient connected to nasal cannula oxygen  Post-op Assessment: Report given to RN and Post -op Vital signs reviewed and stable  Post vital signs: Reviewed and stable  Last Vitals:  Vitals:   11/05/16 1232  BP: 123/81  Pulse: 71  Resp: 18  Temp: 36.9 C    Last Pain:  Vitals:   11/05/16 1232  TempSrc: Oral      Patients Stated Pain Goal: 3 (61/47/09 2957)  Complications: No apparent anesthesia complications

## 2016-11-05 NOTE — Anesthesia Preprocedure Evaluation (Addendum)
Anesthesia Evaluation  Patient identified by MRN, date of birth, ID band Patient awake    Reviewed: Allergy & Precautions, NPO status , Patient's Chart, lab work & pertinent test results  History of Anesthesia Complications (+) PONV  Airway Mallampati: I  TM Distance: >3 FB Neck ROM: Full    Dental  (+) Dental Advisory Given   Pulmonary neg pulmonary ROS,    breath sounds clear to auscultation       Cardiovascular negative cardio ROS   Rhythm:Regular Rate:Normal     Neuro/Psych negative neurological ROS     GI/Hepatic negative GI ROS, Neg liver ROS,   Endo/Other  negative endocrine ROS  Renal/GU negative Renal ROS     Musculoskeletal   Abdominal   Peds  Hematology negative hematology ROS (+)   Anesthesia Other Findings   Reproductive/Obstetrics Retained placental products Still nursing                             Anesthesia Physical Anesthesia Plan  ASA: I  Anesthesia Plan: MAC   Post-op Pain Management:    Induction: Intravenous  Airway Management Planned: Natural Airway and Simple Face Mask  Additional Equipment:   Intra-op Plan:   Post-operative Plan:   Informed Consent: I have reviewed the patients History and Physical, chart, labs and discussed the procedure including the risks, benefits and alternatives for the proposed anesthesia with the patient or authorized representative who has indicated his/her understanding and acceptance.   Dental advisory given  Plan Discussed with: CRNA and Surgeon  Anesthesia Plan Comments: (Plan routine monitors, MAC)        Anesthesia Quick Evaluation

## 2016-11-05 NOTE — H&P (Signed)
36 year old G 3 P 2 had  D and E for missed abortion Still with persistent UPT and ultrasound consistent with  Possible quarter sized retained tissue.  Past Medical History:  Diagnosis Date  . Anemia    WITH PREGNANCY  . Heart murmur    AS A CHILD  . Hemorrhage after delivery of fetus 01/2013  . History of cryosurgery   . PONV (postoperative nausea and vomiting)   . Urinary tract infection    Past Surgical History:  Procedure Laterality Date  . DILATION AND EVACUATION N/A 09/24/2016   Procedure: DILATATION AND EVACUATION with ultrasound guidance;  Surgeon: Marcelle OverlieMichelle Rajanae Mantia, MD;  Location: [facility];  Service: Gynecology;  Laterality: N/A;  . GYNECOLOGIC CRYOSURGERY  2012  . urethrea stretching  [id number]   36 year old  . WISDOM TOOTH EXTRACTION  2006   Prior to Admission medications   Medication Sig Start Date End Date Taking? Authorizing Provider  Prenatal Vit-Fe Fumarate-FA (PRENATAL MULTIVITAMIN) TABS tablet Take 1 tablet by mouth daily.    Yes [provider]   Allergies  Latex  BP 123/81   Pulse 71   Temp 98.4 F (36.9 C) (Oral)   Resp 18   Ht 5\' 3"  (1.6 m)   Wt 44.5 kg (98 lb)   SpO2 100%   BMI 17.36 kg/m  Abdomen is soft and non tender Pelvic unremarkable  IMPRESSION: Retained tissue  PLAN: D and E with ultrasound guidance

## 2016-11-06 ENCOUNTER — Encounter (HOSPITAL_COMMUNITY): Payer: Self-pay | Admitting: Obstetrics and Gynecology

## 2016-11-06 NOTE — Op Note (Signed)
NAME:  Desiree Garza, Desiree Garza                       ACCOUNT NO.:  MEDICAL RECORD NO.:  [id number]  LOCATION:                                 FACILITY:  PHYSICIAN:  Nasia Cannan L. Helane Rima, M.D.    DATE OF BIRTH:  DATE OF PROCEDURE:  11/05/2016 DATE OF DISCHARGE:                              OPERATIVE REPORT   PREOPERATIVE DIAGNOSIS:  Retained products of conception.  POSTOPERATIVE DIAGNOSIS:  Retained products of conception.  PROCEDURE:  D and C with ultrasound guidance.  SURGEON:  Jeremiah Curci L. Helane Rima, M.D.  ANESTHESIA:  Paracervical block with MAC.  DESCRIPTION OF PROCEDURE:  The patient was taken to the operating room after informed consent was obtained.  She was then prepped and draped in usual sterile fashion.  Ultrasound was present for the entire procedure. Paracervical block was performed.  The uterus was retroverted.  We had a difficult time seeing her endometrial cavity using abdominal probe, so we placed a sterile glove on the ultrasound and performed a vaginal probe ultrasound.  We could see that there was no obvious retained tissue when we placed the Doppler on, but she did have a very small scattered areas in the middle portion of her uterus that may be a scant amount of retained tissue.  I dilated the cervical os with Pratt dilators and then used a sharp curette, was able to curette some tissue that looked like some old fibrotic retained tissue.  There was no obvious large __________ placental tissue noted.  The tissue collectively was about the size of maybe a little bit smaller than a dime.  I was then able to __________.  We performed the ultrasound vaginally again.  I could see no doppler flow on any endometrial tissue. The patient had a minimal amount of bleeding at the end of the procedure.  All sponge, lap, and instrument counts were correct x2.  The patient went to recovery room in stable condition.     Raye Wiens L. Helane Rima, M.D.     Nevin Bloodgood  D:  11/05/2016  T:   11/05/2016  Job:  [id number]

## 2016-11-09 LAB — TYPE AND SCREEN
ABO/RH(D): A NEG
ANTIBODY SCREEN: POSITIVE
DAT, IGG: NEGATIVE
UNIT DIVISION: 0
Unit division: 0

## 2016-11-09 LAB — BPAM RBC
BLOOD PRODUCT EXPIRATION DATE: 201806282359
Blood Product Expiration Date: 201806282359
UNIT TYPE AND RH: 9500
UNIT TYPE AND RH: 9500

## 2017-04-02 LAB — OB RESULTS CONSOLE RUBELLA ANTIBODY, IGM: Rubella: IMMUNE

## 2017-04-02 LAB — OB RESULTS CONSOLE RPR: RPR: NONREACTIVE

## 2017-04-02 LAB — OB RESULTS CONSOLE HIV ANTIBODY (ROUTINE TESTING): HIV: NONREACTIVE

## 2017-04-02 LAB — OB RESULTS CONSOLE HEPATITIS B SURFACE ANTIGEN: HEP B S AG: NEGATIVE

## 2017-04-16 LAB — OB RESULTS CONSOLE GC/CHLAMYDIA
Chlamydia: NEGATIVE
GC PROBE AMP, GENITAL: NEGATIVE

## 2017-04-21 ENCOUNTER — Inpatient Hospital Stay (HOSPITAL_COMMUNITY)
Admission: AD | Admit: 2017-04-21 | Discharge: 2017-04-21 | Disposition: A | Discharge status: Home / Self Care | Payer: 59 | Source: Ambulatory Visit | Attending: Obstetrics and Gynecology | Admitting: Obstetrics and Gynecology

## 2017-04-21 ENCOUNTER — Encounter (HOSPITAL_COMMUNITY): Payer: Self-pay | Admitting: *Deleted

## 2017-04-21 DIAGNOSIS — S0081XA Abrasion of other part of head, initial encounter: Secondary | ICD-10-CM | POA: Diagnosis not present

## 2017-04-21 DIAGNOSIS — Z6791 Unspecified blood type, Rh negative: Secondary | ICD-10-CM | POA: Diagnosis not present

## 2017-04-21 DIAGNOSIS — W19XXXA Unspecified fall, initial encounter: Secondary | ICD-10-CM

## 2017-04-21 DIAGNOSIS — W010XXA Fall on same level from slipping, tripping and stumbling without subsequent striking against object, initial encounter: Secondary | ICD-10-CM | POA: Insufficient documentation

## 2017-04-21 DIAGNOSIS — Z3A10 10 weeks gestation of pregnancy: Secondary | ICD-10-CM | POA: Diagnosis not present

## 2017-04-21 DIAGNOSIS — O26891 Other specified pregnancy related conditions, first trimester: Secondary | ICD-10-CM | POA: Insufficient documentation

## 2017-04-21 DIAGNOSIS — S60512A Abrasion of left hand, initial encounter: Secondary | ICD-10-CM | POA: Insufficient documentation

## 2017-04-21 DIAGNOSIS — O9989 Other specified diseases and conditions complicating pregnancy, childbirth and the puerperium: Secondary | ICD-10-CM

## 2017-04-21 LAB — URINALYSIS, ROUTINE W REFLEX MICROSCOPIC
BILIRUBIN URINE: NEGATIVE
Glucose, UA: NEGATIVE mg/dL
Ketones, ur: NEGATIVE mg/dL
Leukocytes, UA: NEGATIVE
Nitrite: NEGATIVE
PH: 7 (ref 5.0–8.0)
Protein, ur: NEGATIVE mg/dL
Specific Gravity, Urine: 1.011 (ref 1.005–1.030)

## 2017-04-21 LAB — KLEIHAUER-BETKE STAIN
# VIALS RHIG: 1
FETAL CELLS %: 0 %
QUANTITATION FETAL HEMOGLOBIN: 0 mL

## 2017-04-21 NOTE — MAU Provider Note (Signed)
History     CSN: [id number]  Arrival date and time: 04/21/17 1538   First Provider Initiated Contact with Patient 04/21/17 1657      Chief Complaint  Patient presents with  . Fall   HPI  Ms. Desiree Garza is a 36 y.o. [phone] at 2925w6d who presents to MAU today with complaint of a fall today around noon. The patient was walking in the rain with her [id number] year old and holding her 582108 year old. She states that she slipped and fell onto her right hip. She had an abrasion to her chin and left hand after trying to catch the [id number] year old. She did not have any abdominal trauma. She denies vaginal bleeding or LOF. She denies abdominal pain at this time.    OB History    Gravida Para Term Preterm AB Living   4 2 2   1 2    SAB TAB Ectopic Multiple Live Births   1     0 2      Past Medical History:  Diagnosis Date  . Anemia    WITH PREGNANCY  . Heart murmur    AS A CHILD  . Hemorrhage after delivery of fetus 01/2013  . History of cryosurgery   . PONV (postoperative nausea and vomiting)   . Urinary tract infection     Past Surgical History:  Procedure Laterality Date  . GYNECOLOGIC CRYOSURGERY  2012  . urethrea stretching  [id number]   [age] year old  . WISDOM TOOTH EXTRACTION  2006    Family History  Problem Relation Age of Onset  . Cancer Father     Social History   Tobacco Use  . Smoking status: Never Smoker  . Smokeless tobacco: Never Used  Substance Use Topics  . Alcohol use: Yes    Alcohol/week: 0.6 oz    Types: 1 Glasses of wine per week    Comment: prior to pregnancy occasional  . Drug use: No    Allergies:  Allergies  Allergen Reactions  . Latex Other (See Comments)    uti - caused by latex condoms     No medications prior to admission.    Review of Systems  Gastrointestinal: Negative for abdominal pain.  Genitourinary: Negative for vaginal bleeding and vaginal discharge.   Physical Exam   Blood pressure 119/83, pulse 95, temperature 98.1 F (36.7 C),  temperature source Oral, resp. rate 18, last menstrual period 02/04/2017, unknown if currently breastfeeding.  Physical Exam  Constitutional: She is oriented to person, place, and time. She appears well-developed and well-nourished. No distress.  HENT:  Head: Normocephalic.    Eyes: EOM are normal.  Cardiovascular: Normal rate.  Respiratory: Effort normal.  GI: Soft.  Neurological: She is alert and oriented to person, place, and time.  Skin: Skin is warm and dry. No erythema.  Psychiatric: She has a normal mood and affect. Her behavior is normal. Judgment and thought content normal.    Results for orders placed or performed during the hospital encounter of 04/21/17 (from the past 24 hour(s))  Urinalysis, Routine w reflex microscopic     Status: Abnormal   Collection Time: 04/21/17  4:45 PM  Result Value Ref Range   Color, Urine YELLOW YELLOW   APPearance HAZY (A) CLEAR   Specific Gravity, Urine 1.011 1.005 - 1.030   pH 7.0 5.0 - 8.0   Glucose, UA NEGATIVE NEGATIVE mg/dL   Hgb urine dipstick SMALL (A) NEGATIVE   Bilirubin Urine NEGATIVE NEGATIVE  Ketones, ur NEGATIVE NEGATIVE mg/dL   Protein, ur NEGATIVE NEGATIVE mg/dL   Nitrite NEGATIVE NEGATIVE   Leukocytes, UA NEGATIVE NEGATIVE   RBC / HPF 0-5 0 - 5 RBC/hpf   WBC, UA 0-5 0 - 5 WBC/hpf   Bacteria, UA RARE (A) NONE SEEN   Squamous Epithelial / LPF 0-5 (A) NONE SEEN   Mucus PRESENT     MAU Course  Procedures None  MDM FHR - 177 bpm with doppler Discussed patient with Dr. Renaldo FiddlerAdkins. Recommends KB today. Patient may be discharged and called with results tomorrow. If Rhogam is needed, she may be instructed to come to the office tomorrow.   Assessment and Plan  A: SIUP at 197w6d Fall, initial encounter Rh Negative  P: Discharge home Tylenol PRN for pain advised Ice and rest encouraged First trimester precautions discussed Patient advised to follow-up with [facility] as scheduled or sooner if symptoms  worsen KB pending. Will contact patient with any positive results tomorrow.  Patient may return to MAU as needed or if her condition were to change or worsen   Vonzella NippleJulie Stokely Jeancharles, PA-C 04/21/2017, 6:02 PM

## 2017-04-21 NOTE — MAU Note (Signed)
Pt presents to MAU with complaints of falling over her child and landing on her right side and catching herself on her hand. Denies any VB or abnormal discharge

## 2017-04-21 NOTE — Discharge Instructions (Signed)
Preventing Injuries During Pregnancy Injuries can happen during pregnancy. Minor falls and accidents usually do not harm you or your baby. But some injuries can harm you and your baby. Tell your doctor about any injury you suffer. What can I do to avoid injuries? Safety  Remove rugs and loose objects on the floor.  Wear comfortable shoes that have a good grip. Do not wear shoes that have high heels.  Always wear your seat belt in the car. The lap belt should be below your belly. Always drive safely.  Do not ride on a motorcycle. Activity  Do not take part in rough and violent activities or sports.  Avoid: ? Walking on wet or slippery floors. ? Lifting heavy pots of boiling or hot liquids. ? Fixing electrical problems. ? Being near fires. General instructions  Take over-the-counter and prescription medicines only as told by your doctor.  Know your blood type and the blood type of the baby's father.  If you are a victim of domestic violence: ? Call your local emergency services (911 in the [place] ? Contact the National Domestic Violence Hotline for help and support. Get help right away if:  You fall on your belly or receive any serious blow to your belly.  You have a stiff neck or neck pain after a fall or an injury.  You get a headache or have problems with vision after an injury.  You do not feel the baby move or the baby is not moving as much as normal.  You have been a victim of domestic violence or any other kind of attack.  You have been in a car accident.  You have bleeding from your vagina.  Fluid is leaking from your vagina.  You start to have cramping or pain in your belly (contractions).  You have very bad pain in your lower back.  You feel weak or pass out (faint).  You start to throw up (vomit) after an injury.  You have been burned. Summary  Some injuries that happen during pregnancy can do harm to the baby.  Tell your doctor about any  injury.  Take steps to avoid injury. This includes removing rugs and loose objects on the floor. Always wear your seat belt in the car.  Do not take part in rough and violent activities or sports.  Get help right away if you have any serious accident or injury. This information is not intended to replace advice given to you by your health care provider. Make sure you discuss any questions you have with your health care provider. Document Released: 06/29/2010 Document Revised: 06/05/2016 Document Reviewed: 06/05/2016 Elsevier Interactive Patient Education  2017 [employer]  

## 2017-04-22 ENCOUNTER — Telehealth: Payer: Self-pay | Admitting: Medical

## 2017-04-22 NOTE — Telephone Encounter (Signed)
LM for return call for results. KB was negative. Want to reassure patient after fall yesterday given Rh negative blood type.   Marny LowensteinWenzel, Akeira Lahm N, PA-C 04/22/2017 8:25 AM

## 2017-10-13 LAB — OB RESULTS CONSOLE GBS: STREP GROUP B AG: NEGATIVE

## 2017-10-22 ENCOUNTER — Telehealth (HOSPITAL_COMMUNITY): Payer: Self-pay | Admitting: *Deleted

## 2017-10-22 ENCOUNTER — Encounter (HOSPITAL_COMMUNITY): Payer: Self-pay | Admitting: *Deleted

## 2017-10-22 NOTE — Telephone Encounter (Signed)
Preadmission screen  

## 2017-10-24 ENCOUNTER — Telehealth (HOSPITAL_COMMUNITY): Payer: Self-pay | Admitting: *Deleted

## 2017-10-24 NOTE — Telephone Encounter (Signed)
Preadmission screen  

## 2017-10-29 ENCOUNTER — Encounter (HOSPITAL_COMMUNITY): Payer: Self-pay | Admitting: *Deleted

## 2017-10-29 ENCOUNTER — Telehealth (HOSPITAL_COMMUNITY): Payer: Self-pay | Admitting: *Deleted

## 2017-10-29 NOTE — Telephone Encounter (Signed)
Preadmission screen  

## 2017-11-04 ENCOUNTER — Inpatient Hospital Stay (HOSPITAL_COMMUNITY): Payer: 59 | Admitting: Anesthesiology

## 2017-11-04 ENCOUNTER — Encounter (HOSPITAL_COMMUNITY)
Admission: RE | Disposition: A | Discharge status: Home / Self Care | Payer: Self-pay | Source: Ambulatory Visit | Attending: Obstetrics and Gynecology

## 2017-11-04 ENCOUNTER — Inpatient Hospital Stay (HOSPITAL_COMMUNITY)
Admission: RE | Admit: 2017-11-04 | Discharge: 2017-11-06 | DRG: 768 | Disposition: A | Discharge status: Home / Self Care | Payer: 59 | Source: Ambulatory Visit | Attending: Obstetrics and Gynecology | Admitting: Obstetrics and Gynecology

## 2017-11-04 ENCOUNTER — Other Ambulatory Visit: Payer: Self-pay

## 2017-11-04 ENCOUNTER — Encounter (HOSPITAL_COMMUNITY): Payer: Self-pay

## 2017-11-04 VITALS — BP 93/65 | HR 85 | Temp 98.2°F | Resp 19 | Ht 63.0 in | Wt 156.0 lb

## 2017-11-04 DIAGNOSIS — Z3A39 39 weeks gestation of pregnancy: Secondary | ICD-10-CM | POA: Diagnosis not present

## 2017-11-04 DIAGNOSIS — Z3493 Encounter for supervision of normal pregnancy, unspecified, third trimester: Secondary | ICD-10-CM

## 2017-11-04 DIAGNOSIS — O9902 Anemia complicating childbirth: Principal | ICD-10-CM | POA: Diagnosis present

## 2017-11-04 DIAGNOSIS — O26893 Other specified pregnancy related conditions, third trimester: Secondary | ICD-10-CM | POA: Diagnosis present

## 2017-11-04 DIAGNOSIS — Z3490 Encounter for supervision of normal pregnancy, unspecified, unspecified trimester: Secondary | ICD-10-CM

## 2017-11-04 DIAGNOSIS — D649 Anemia, unspecified: Secondary | ICD-10-CM | POA: Diagnosis present

## 2017-11-04 HISTORY — PX: DILATION AND EVACUATION: SHX1459

## 2017-11-04 LAB — RPR: RPR Ser Ql: NONREACTIVE

## 2017-11-04 LAB — CBC
HCT: 35.5 % — ABNORMAL LOW (ref 36.0–46.0)
HCT: 24.8 % — ABNORMAL LOW (ref 36.0–46.0)
HEMOGLOBIN: 11.9 g/dL — AB (ref 12.0–15.0)
HEMOGLOBIN: 8.5 g/dL — AB (ref 12.0–15.0)
MCH: 32.2 pg (ref 26.0–34.0)
MCH: 32.9 pg (ref 26.0–34.0)
MCHC: 33.5 g/dL (ref 30.0–36.0)
MCHC: 34.3 g/dL (ref 30.0–36.0)
MCV: 96.2 fL (ref 78.0–100.0)
MCV: 96.1 fL (ref 78.0–100.0)
PLATELETS: 149 10*3/uL — AB (ref 150–400)
Platelets: 165 10*3/uL (ref 150–400)
RBC: 3.69 MIL/uL — ABNORMAL LOW (ref 3.87–5.11)
RBC: 2.58 MIL/uL — ABNORMAL LOW (ref 3.87–5.11)
RDW: 13.4 % (ref 11.5–15.5)
RDW: 13.2 % (ref 11.5–15.5)
WBC: 6.1 10*3/uL (ref 4.0–10.5)
WBC: 14.3 10*3/uL — ABNORMAL HIGH (ref 4.0–10.5)

## 2017-11-04 LAB — PREPARE RBC (CROSSMATCH)

## 2017-11-04 SURGERY — DILATION AND EVACUATION, UTERUS
Anesthesia: Epidural | Site: Vagina | Wound class: Clean Contaminated

## 2017-11-04 MED ORDER — ONDANSETRON HCL 4 MG/2ML IJ SOLN
4.0000 mg | Freq: Four times a day (QID) | INTRAMUSCULAR | Status: DC | PRN
  Administered 2017-11-04: 4 mg via INTRAVENOUS

## 2017-11-04 MED ORDER — LACTATED RINGERS IV SOLN: 500.0000 mL | INTRAVENOUS | Status: DC | PRN

## 2017-11-04 MED ORDER — MEDROXYPROGESTERONE ACETATE 150 MG/ML IM SUSP: 150.0000 mg | INTRAMUSCULAR | Status: DC | PRN

## 2017-11-04 MED ORDER — METHYLERGONOVINE MALEATE 0.2 MG/ML IJ SOLN
0.2000 mg | Freq: Once | INTRAMUSCULAR | Status: AC
  Administered 2017-11-04: 0.2 mg via INTRAMUSCULAR

## 2017-11-04 MED ORDER — CEFOTETAN DISODIUM-DEXTROSE 2-2.08 GM-%(50ML) IV SOLR: 2.0000 g | Freq: Once | INTRAVENOUS | Status: DC

## 2017-11-04 MED ORDER — OXYCODONE HCL 5 MG PO TABS: 5.0000 mg | ORAL_TABLET | Freq: Once | ORAL | Status: DC | PRN

## 2017-11-04 MED ORDER — SODIUM CHLORIDE 0.9% FLUSH
INTRAVENOUS | Status: AC
  Filled 2017-11-04: qty 3

## 2017-11-04 MED ORDER — LACTATED RINGERS IV SOLN
INTRAVENOUS | Status: DC | PRN
  Administered 2017-11-04: 12:00:00 via INTRAVENOUS

## 2017-11-04 MED ORDER — WITCH HAZEL-GLYCERIN EX PADS: 1.0000 "application " | MEDICATED_PAD | CUTANEOUS | Status: DC | PRN

## 2017-11-04 MED ORDER — METHYLERGONOVINE MALEATE 0.2 MG/ML IJ SOLN
INTRAMUSCULAR | Status: AC
  Filled 2017-11-04: qty 1

## 2017-11-04 MED ORDER — PHENYLEPHRINE 40 MCG/ML (10ML) SYRINGE FOR IV PUSH (FOR BLOOD PRESSURE SUPPORT)
80.0000 ug | PREFILLED_SYRINGE | INTRAVENOUS | Status: DC | PRN
  Filled 2017-11-04: qty 10

## 2017-11-04 MED ORDER — TERBUTALINE SULFATE 1 MG/ML IJ SOLN: 0.2500 mg | Freq: Once | INTRAMUSCULAR | Status: DC | PRN

## 2017-11-04 MED ORDER — IBUPROFEN 600 MG PO TABS
600.0000 mg | ORAL_TABLET | Freq: Four times a day (QID) | ORAL | Status: DC
  Administered 2017-11-04 – 2017-11-06 (×8): 600 mg via ORAL
  Filled 2017-11-04 (×8): qty 1

## 2017-11-04 MED ORDER — BUTORPHANOL TARTRATE 1 MG/ML IJ SOLN: 1.0000 mg | INTRAMUSCULAR | Status: DC | PRN

## 2017-11-04 MED ORDER — TETANUS-DIPHTH-ACELL PERTUSSIS 5-2.5-18.5 LF-MCG/0.5 IM SUSP: 0.5000 mL | Freq: Once | INTRAMUSCULAR | Status: DC

## 2017-11-04 MED ORDER — SOD CITRATE-CITRIC ACID 500-334 MG/5ML PO SOLN
30.0000 mL | ORAL | Status: DC | PRN
  Administered 2017-11-04: 30 mL via ORAL
  Filled 2017-11-04: qty 15

## 2017-11-04 MED ORDER — ACETAMINOPHEN 325 MG PO TABS: 650.0000 mg | ORAL_TABLET | ORAL | Status: DC | PRN

## 2017-11-04 MED ORDER — EPHEDRINE 5 MG/ML INJ: 10.0000 mg | INTRAVENOUS | Status: DC | PRN

## 2017-11-04 MED ORDER — MISOPROSTOL 200 MCG PO TABS
800.0000 ug | ORAL_TABLET | Freq: Once | ORAL | Status: AC
  Administered 2017-11-04: 800 ug via RECTAL

## 2017-11-04 MED ORDER — SENNOSIDES-DOCUSATE SODIUM 8.6-50 MG PO TABS
2.0000 | ORAL_TABLET | ORAL | Status: DC
  Administered 2017-11-04 – 2017-11-06 (×2): 2 via ORAL
  Filled 2017-11-04 (×2): qty 2

## 2017-11-04 MED ORDER — SODIUM CHLORIDE 0.9 % IR SOLN
Status: DC | PRN
  Administered 2017-11-04: 1000 mL

## 2017-11-04 MED ORDER — MISOPROSTOL 200 MCG PO TABS
ORAL_TABLET | ORAL | Status: AC
  Filled 2017-11-04: qty 4

## 2017-11-04 MED ORDER — OXYTOCIN 40 UNITS IN LACTATED RINGERS INFUSION - SIMPLE MED
1.0000 m[IU]/min | INTRAVENOUS | Status: DC
  Administered 2017-11-04: 2 m[IU]/min via INTRAVENOUS
  Filled 2017-11-04 (×2): qty 1000

## 2017-11-04 MED ORDER — PRENATAL MULTIVITAMIN CH
1.0000 | ORAL_TABLET | Freq: Every day | ORAL | Status: DC
  Administered 2017-11-05 – 2017-11-06 (×2): 1 via ORAL
  Filled 2017-11-04 (×2): qty 1

## 2017-11-04 MED ORDER — OXYCODONE-ACETAMINOPHEN 5-325 MG PO TABS: 1.0000 | ORAL_TABLET | ORAL | Status: DC | PRN

## 2017-11-04 MED ORDER — LACTATED RINGERS IV SOLN
500.0000 mL | Freq: Once | INTRAVENOUS | Status: AC
  Administered 2017-11-04: 500 mL via INTRAVENOUS

## 2017-11-04 MED ORDER — BENZOCAINE-MENTHOL 20-0.5 % EX AERO
1.0000 "application " | INHALATION_SPRAY | CUTANEOUS | Status: DC | PRN
  Administered 2017-11-04: 1 via TOPICAL
  Filled 2017-11-04: qty 56

## 2017-11-04 MED ORDER — LIDOCAINE HCL (PF) 1 % IJ SOLN
INTRAMUSCULAR | Status: DC | PRN
  Administered 2017-11-04 (×2): 5 mL via EPIDURAL

## 2017-11-04 MED ORDER — SCOPOLAMINE 1 MG/3DAYS TD PT72
MEDICATED_PATCH | TRANSDERMAL | Status: DC | PRN
  Administered 2017-11-04: 1 via TRANSDERMAL

## 2017-11-04 MED ORDER — DEXAMETHASONE SODIUM PHOSPHATE 10 MG/ML IJ SOLN
INTRAMUSCULAR | Status: DC | PRN
  Administered 2017-11-04: 10 mg via INTRAVENOUS

## 2017-11-04 MED ORDER — CEFAZOLIN SODIUM-DEXTROSE 2-3 GM-%(50ML) IV SOLR
INTRAVENOUS | Status: DC | PRN
  Administered 2017-11-04: 2 g via INTRAVENOUS

## 2017-11-04 MED ORDER — COCONUT OIL OIL
1.0000 "application " | TOPICAL_OIL | Status: DC | PRN
  Administered 2017-11-05: 1 via TOPICAL
  Filled 2017-11-04: qty 120

## 2017-11-04 MED ORDER — OXYTOCIN 40 UNITS IN LACTATED RINGERS INFUSION - SIMPLE MED: 2.5000 [IU]/h | INTRAVENOUS | Status: DC

## 2017-11-04 MED ORDER — OXYCODONE-ACETAMINOPHEN 5-325 MG PO TABS: 2.0000 | ORAL_TABLET | ORAL | Status: DC | PRN

## 2017-11-04 MED ORDER — DIBUCAINE 1 % RE OINT: 1.0000 "application " | TOPICAL_OINTMENT | RECTAL | Status: DC | PRN

## 2017-11-04 MED ORDER — DIPHENHYDRAMINE HCL 50 MG/ML IJ SOLN: 12.5000 mg | INTRAMUSCULAR | Status: DC | PRN

## 2017-11-04 MED ORDER — LIDOCAINE HCL (PF) 1 % IJ SOLN
30.0000 mL | INTRAMUSCULAR | Status: DC | PRN
  Filled 2017-11-04: qty 30

## 2017-11-04 MED ORDER — SODIUM CHLORIDE 0.9 % IV SOLN: 10.0000 mL/h | Freq: Once | INTRAVENOUS | Status: DC

## 2017-11-04 MED ORDER — SIMETHICONE 80 MG PO CHEW: 80.0000 mg | CHEWABLE_TABLET | ORAL | Status: DC | PRN

## 2017-11-04 MED ORDER — PROMETHAZINE HCL 25 MG/ML IJ SOLN: 6.2500 mg | INTRAMUSCULAR | Status: DC | PRN

## 2017-11-04 MED ORDER — ONDANSETRON HCL 4 MG PO TABS: 4.0000 mg | ORAL_TABLET | ORAL | Status: DC | PRN

## 2017-11-04 MED ORDER — DIPHENHYDRAMINE HCL 25 MG PO CAPS: 25.0000 mg | ORAL_CAPSULE | Freq: Four times a day (QID) | ORAL | Status: DC | PRN

## 2017-11-04 MED ORDER — ONDANSETRON HCL 4 MG/2ML IJ SOLN: 4.0000 mg | INTRAMUSCULAR | Status: DC | PRN

## 2017-11-04 MED ORDER — MEASLES, MUMPS & RUBELLA VAC ~~LOC~~ INJ
0.5000 mL | INJECTION | Freq: Once | SUBCUTANEOUS | Status: DC
  Filled 2017-11-04: qty 0.5

## 2017-11-04 MED ORDER — LACTATED RINGERS IV SOLN
INTRAVENOUS | Status: DC
  Administered 2017-11-04: 08:00:00 via INTRAVENOUS

## 2017-11-04 MED ORDER — OXYCODONE HCL 5 MG/5ML PO SOLN: 5.0000 mg | Freq: Once | ORAL | Status: DC | PRN

## 2017-11-04 MED ORDER — SODIUM CHLORIDE 0.9 % IV SOLN
1000.0000 mg | INTRAVENOUS | Status: AC
  Administered 2017-11-04: 1000 mg via INTRAVENOUS
  Filled 2017-11-04: qty 1100

## 2017-11-04 MED ORDER — FENTANYL 2.5 MCG/ML BUPIVACAINE 1/10 % EPIDURAL INFUSION (WH - ANES)
14.0000 mL/h | INTRAMUSCULAR | Status: DC | PRN
  Administered 2017-11-04: 14 mL/h via EPIDURAL
  Filled 2017-11-04: qty 100

## 2017-11-04 MED ORDER — HYDROMORPHONE HCL 1 MG/ML IJ SOLN: 0.2500 mg | INTRAMUSCULAR | Status: DC | PRN

## 2017-11-04 MED ORDER — OXYTOCIN BOLUS FROM INFUSION
500.0000 mL | Freq: Once | INTRAVENOUS | Status: AC
  Administered 2017-11-04: 500 mL via INTRAVENOUS

## 2017-11-04 SURGICAL SUPPLY — 22 items
ADAPTER VACURETTE TBG SET 14 (CANNULA) ×2 IMPLANT
BALLN POSTPARTUM SOS BAKRI (BALLOONS) ×3
BALLOON POSTPARTUM SOS BAKRI (BALLOONS) IMPLANT
CLOTH BEACON ORANGE TIMEOUT ST (SAFETY) ×3 IMPLANT
GLOVE BIO SURGEON STRL SZ 6.5 (GLOVE) ×2 IMPLANT
GLOVE BIO SURGEONS STRL SZ 6.5 (GLOVE) ×1
GLOVE BIOGEL PI IND STRL 7.0 (GLOVE) ×2 IMPLANT
GLOVE BIOGEL PI INDICATOR 7.0 (GLOVE) ×4
GOWN STRL REUS W/TWL LRG LVL3 (GOWN DISPOSABLE) ×6 IMPLANT
KIT BERKELEY 1ST TRIMESTER 3/8 (MISCELLANEOUS) ×3 IMPLANT
NS IRRIG 1000ML POUR BTL (IV SOLUTION) ×3 IMPLANT
PACK VAGINAL MINOR WOMEN LF (CUSTOM PROCEDURE TRAY) ×3 IMPLANT
PAD OB MATERNITY 4.3X12.25 (PERSONAL CARE ITEMS) ×3 IMPLANT
PAD PREP 24X48 CUFFED NSTRL (MISCELLANEOUS) ×3 IMPLANT
SET BERKELEY SUCTION TUBING (SUCTIONS) ×5 IMPLANT
SYR 50ML LL SCALE MARK (SYRINGE) ×6 IMPLANT
TOWEL OR 17X24 6PK STRL BLUE (TOWEL DISPOSABLE) ×6 IMPLANT
TUBE VACURETTE 2ND TRIMESTER (CANNULA) ×2 IMPLANT
TUBING NON-CON 1/4 X 20 CONN (TUBING) ×1 IMPLANT
TUBING NON-CON 1/4 X 20' CONN (TUBING) ×1
VACURETTE 14MM CVD 1/2 BASE (CANNULA) ×2 IMPLANT
YANKAUER SUCT BULB TIP NO VENT (SUCTIONS) ×2 IMPLANT

## 2017-11-04 NOTE — Anesthesia Preprocedure Evaluation (Signed)
Anesthesia Evaluation  Patient identified by MRN, date of birth, ID band Patient awake    Reviewed: Allergy & Precautions, H&P , NPO status , Patient's Chart, lab work & pertinent test results  History of Anesthesia Complications Negative for: history of anesthetic complications  Airway Mallampati: II  TM Distance: >3 FB Neck ROM: full    Dental no notable dental hx. (+) Teeth Intact   Pulmonary neg pulmonary ROS,    Pulmonary exam normal breath sounds clear to auscultation       Cardiovascular negative cardio ROS Normal cardiovascular exam Rhythm:regular Rate:Normal     Neuro/Psych negative neurological ROS  negative psych ROS   GI/Hepatic negative GI ROS, Neg liver ROS,   Endo/Other  negative endocrine ROS  Renal/GU negative Renal ROS  negative genitourinary   Musculoskeletal   Abdominal   Peds  Hematology  (+) anemia ,   Anesthesia Other Findings   Reproductive/Obstetrics (+) Pregnancy                             Anesthesia Physical  Anesthesia Plan  ASA: II  Anesthesia Plan: Epidural   Post-op Pain Management:    Induction:   PONV Risk Score and Plan:   Airway Management Planned:   Additional Equipment:   Intra-op Plan:   Post-operative Plan:   Informed Consent: I have reviewed the patients History and Physical, chart, labs and discussed the procedure including the risks, benefits and alternatives for the proposed anesthesia with the patient or authorized representative who has indicated his/her understanding and acceptance.       Plan Discussed with:   Anesthesia Plan Comments:         Anesthesia Quick Evaluation  

## 2017-11-04 NOTE — Addendum Note (Signed)
Addendum  created 11/04/17 1829 by Shanon Payor, CRNA   Sign clinical note

## 2017-11-04 NOTE — Progress Notes (Signed)
SVD of vigorous female infant  Placenta delivered spontaneous w/ 3VC.   2nd degree lac repaired w/ 3-0 vicryl rapide.  PP hemorrhage not improved with bimanual sweep and massage IM methergine and PR misoprostol given.   Continued to have active bleeding rec D&C with possible bakri balloon placement R/b/a discussed, informed consent

## 2017-11-04 NOTE — Anesthesia Preprocedure Evaluation (Signed)
Anesthesia Evaluation  Patient identified by MRN, date of birth, ID band Patient awake    Reviewed: Allergy & Precautions, H&P , NPO status , Patient's Chart, lab work & pertinent test results  History of Anesthesia Complications Negative for: history of anesthetic complications  Airway Mallampati: II  TM Distance: >3 FB Neck ROM: full    Dental no notable dental hx. (+) Teeth Intact   Pulmonary neg pulmonary ROS,    Pulmonary exam normal breath sounds clear to auscultation       Cardiovascular negative cardio ROS Normal cardiovascular exam Rhythm:regular Rate:Normal     Neuro/Psych negative neurological ROS  negative psych ROS   GI/Hepatic negative GI ROS, Neg liver ROS,   Endo/Other  negative endocrine ROS  Renal/GU negative Renal ROS     Musculoskeletal   Abdominal   Peds  Hematology  (+) anemia ,   Anesthesia Other Findings post partum hemmorrahge  Reproductive/Obstetrics                             Anesthesia Physical  Anesthesia Plan  ASA: II and emergent  Anesthesia Plan: Epidural   Post-op Pain Management:    Induction:   PONV Risk Score and Plan:   Airway Management Planned:   Additional Equipment:   Intra-op Plan:   Post-operative Plan:   Informed Consent: I have reviewed the patients History and Physical, chart, labs and discussed the procedure including the risks, benefits and alternatives for the proposed anesthesia with the patient or authorized representative who has indicated his/her understanding and acceptance.     Plan Discussed with:   Anesthesia Plan Comments:         Anesthesia Quick Evaluation

## 2017-11-04 NOTE — Transfer of Care (Signed)
Immediate Anesthesia Transfer of Care Note  Patient: Desiree Garza  Procedure(s) Performed: DILATATION AND EVACUATION (N/A Vagina )  Patient Location: PACU  Anesthesia Type:Epidural  Level of Consciousness: awake, alert , oriented and patient cooperative  Airway & Oxygen Therapy: Patient Spontanous Breathing  Post-op Assessment: Report given to RN, Post -op Vital signs reviewed and stable and Patient moving all extremities X 4  Post vital signs: Reviewed and stable  Last Vitals:  Vitals Value Taken Time  BP 93/54 11/04/2017 12:31 PM  Temp    Pulse 88 11/04/2017 12:33 PM  Resp 21 11/04/2017 12:33 PM  SpO2 100 % 11/04/2017 12:33 PM  Vitals shown include unvalidated device data.  Last Pain:  Vitals:   11/04/17 1016  TempSrc: Oral  PainSc:          Complications: No apparent anesthesia complications

## 2017-11-04 NOTE — Anesthesia Procedure Notes (Signed)
Epidural Patient location during procedure: OB Start time: 11/04/2017 10:00 AM End time: 11/04/2017 10:10 AM  Staffing Anesthesiologist: Leonides Grills, MD Performed: anesthesiologist   Preanesthetic Checklist Completed: patient identified, site marked, pre-op evaluation, timeout performed, IV checked, risks and benefits discussed and monitors and equipment checked  Epidural Patient position: sitting Prep: DuraPrep Patient monitoring: heart rate, cardiac monitor, continuous pulse ox and blood pressure Approach: midline Location: L3-L4 Injection technique: LOR air  Needle:  Needle type: Tuohy  Needle gauge: 17 G Needle length: 9 cm Needle insertion depth: 4 cm Catheter type: closed end flexible Catheter size: 19 Gauge Catheter at skin depth: 9 cm Test dose: negative and Other  Assessment Events: blood not aspirated, injection not painful, no injection resistance and negative IV test  Additional Notes Informed consent obtained prior to proceeding including risk of failure, 1% risk of PDPH, risk of minor discomfort and bruising. Discussed alternatives to epidural analgesia and patient desires to proceed.  Timeout performed pre-procedure verifying patient name, procedure, and platelet count.  Patient tolerated procedure well. Reason for block:procedure for pain

## 2017-11-04 NOTE — Anesthesia Postprocedure Evaluation (Signed)
Anesthesia Post Note  Patient: Desiree Garza  Procedure(s) Performed: DILATATION AND EVACUATION (N/A Vagina )     Patient location during evaluation: PACU Anesthesia Type: Epidural Level of consciousness: awake and alert Pain management: pain level controlled Vital Signs Assessment: post-procedure vital signs reviewed and stable Respiratory status: spontaneous breathing, nonlabored ventilation and respiratory function stable Cardiovascular status: stable Postop Assessment: no headache, no backache and epidural receding Anesthetic complications: no    Last Vitals:  Vitals:   11/04/17 1353 11/04/17 1524  BP: 121/71 110/79  Pulse: 78 88  Resp: 18 20  Temp: 37.1 C 36.7 C  SpO2: 100% 98%    Last Pain:  Vitals:   11/04/17 1701  TempSrc:   PainSc: 0-No pain   Pain Goal:                 Catheryn Bacon Murry Diaz

## 2017-11-04 NOTE — Progress Notes (Signed)
Pt comfortable  FHT cat 1 toco irreg Cvx 4/90/-2 AROM - clear  A/P:  Pitocin IOL Epidural prn

## 2017-11-04 NOTE — Lactation Note (Signed)
This note was copied from a baby's chart. Lactation Consultation Note Baby 11 hrs old. Mom states has BF ok, not that hungry to BF. Had short feedings. Mom stated felt feedingss going ok so far. Mom's 3rd child. Mom BF her 1st child now 35 yrs old for 64 months, 2nd child now 2 1/2 yrs old for 17 months. Mom had clogged ducts frequently w/both children. Mom had multiple visits and calls w/LC. mentioned warm shower, breast massage and BF baby leaning over baby and breast massage. Mom stated that's the only way to get them unclogged, but keeping from getting them was impossible.  Encouraged mom to call for assistance or questions. [facility] brochure given w/resources, support groups and LC services.  Patient Name: Desiree Garza [id number] Date: 11/04/2017 Reason for consult: Initial assessment   Maternal Data Has patient been taught Hand Expression?: Yes Does the patient have breastfeeding experience prior to this delivery?: Yes  Feeding Feeding Type: Breast Fed Length of feed: 0 min  LATCH Score Latch: Too sleepy or reluctant, no latch achieved, no sucking elicited.        Comfort (Breast/Nipple): Soft / non-tender  Hold (Positioning): No assistance needed to correctly position infant at breast.     Interventions Interventions: Breast feeding basics reviewed  Lactation Tools Discussed/Used WIC Program: No   Consult Status Consult Status: Follow-up Date: 11/05/17 Follow-up type: In-patient    Charyl Dancer 11/04/2017, 11:04 PM

## 2017-11-04 NOTE — H&P (Signed)
Desiree Garza is a 37 y.o. female presenting for IOL.  Pregnancy uncomplicated. No vb or lof.  Occasional ctx OB History    Gravida  4   Para  2   Term  2   Preterm      AB  1   Living  2     SAB  1   TAB      Ectopic      Multiple  0   Live Births  2          Past Medical History:  Diagnosis Date  . Anemia    WITH PREGNANCY  . Heart murmur    AS A CHILD  . Hemorrhage after delivery of fetus 01/2013  . History of cryosurgery   . PONV (postoperative nausea and vomiting)   . Urinary tract infection   . Vaginal Pap smear, abnormal    Past Surgical History:  Procedure Laterality Date  . DILATION AND EVACUATION N/A 09/24/2016   Procedure: DILATATION AND EVACUATION with ultrasound guidance;  Surgeon: Desiree Overlie, MD;  Location: [facility];  Service: Gynecology;  Laterality: N/A;  . DILATION AND EVACUATION N/A 11/05/2016   Procedure: DILATATION AND EVACUATION with ultrasound guidance;  Surgeon: Desiree Overlie, MD;  Location: [facility];  Service: Gynecology;  Laterality: N/A;  . GYNECOLOGIC CRYOSURGERY  2012  . urethrea stretching  [id number]   37 year old  . WISDOM TOOTH EXTRACTION  2006   Family History: family history includes Cancer in her father; Hypertension in her father and mother; Stroke in her paternal grandmother; Thyroid disease in her maternal grandmother. Social History:  reports that she has never smoked. She has never used smokeless tobacco. She reports that she drinks about 0.6 oz of alcohol per week. She reports that she does not use drugs.     Maternal Diabetes: No Genetic Screening: Normal Maternal Ultrasounds/Referrals: Normal Fetal Ultrasounds or other Referrals:  None Maternal Substance Abuse:  No Significant Maternal Medications:  None Significant Maternal Lab Results:  None Other Comments:  None  ROS History   Af, VSS Exam Physical Exam  Gen - NAD Abd - gravid, NT Ext - NT, no edema Cvx 4cm  Prenatal labs: ABO, Rh: --/--/A NEG  (05/29 1220) Antibody: POS (05/29 1220) Rubella:   RPR:    HBsAg:    HIV:    GBS:   neg  Assessment/Plan: Admit AROM/pitocin Exp mngt Epidural prn   Desiree Garza 11/04/2017, 8:07 AM

## 2017-11-04 NOTE — Anesthesia Postprocedure Evaluation (Signed)
Anesthesia Post Note  Patient: Desiree Garza  Procedure(s) Performed: AN AD HOC LABOR EPIDURAL     Patient location during evaluation: Mother Baby Anesthesia Type: Epidural Level of consciousness: awake and alert and oriented Pain management: pain level controlled Vital Signs Assessment: post-procedure vital signs reviewed and stable Respiratory status: spontaneous breathing, nonlabored ventilation and respiratory function stable Cardiovascular status: stable Postop Assessment: no headache, no backache, epidural receding, patient able to bend at knees, no apparent nausea or vomiting and adequate PO intake Anesthetic complications: no    Last Vitals:  Vitals:   11/04/17 1353 11/04/17 1524  BP: 121/71 110/79  Pulse: 78 88  Resp: 18 20  Temp: 37.1 C 36.7 C  SpO2: 100% 98%    Last Pain:  Vitals:   11/04/17 1701  TempSrc:   PainSc: 0-No pain   Pain Goal:                 Jeanny Rymer

## 2017-11-04 NOTE — Op Note (Signed)
Desiree Garza, CLAASSEN MEDICAL RECORD ZD:[id number] ACCOUNT [id number] DATE OF BIRTH:[birth year] FACILITY: [facility] LOCATION: [facility] PHYSICIAN:Jaxxon Naeem, MD  OPERATIVE REPORT  DATE OF PROCEDURE:  11/04/2017  PREOPERATIVE DIAGNOSIS:  Postpartum hemorrhage.  POSTOPERATIVE DIAGNOSIS:  Postpartum hemorrhage.  PROCEDURE:  Dilation and evacuation with attempted Bakri balloon placement.  PROCEDURE IN DETAIL:  The patient continued to have active postpartum hemorrhage in labor and delivery despite IM Methergine and misoprostol PR.  She was consented for D and E with possible Bakri balloon placement.  She was taken to the operating room.   She was dosed through her epidural and placed in the dorsal lithotomy position using Allen stirrups.  She was prepped and draped sterilely.  Foley catheter remained in from labor and delivery.  Speculum was placed in the vagina, and the anterior lip of  the cervix was grasped with a ring forceps.  A 12-French suction catheter was inserted, and old clot and debris were evacuated from the uterus.  A survey of the cervix was then performed.  No active bleeding or laceration identified.  The Bakri balloon  was inserted into the uterus and placement confirmed with my hand in the vagina.  Fluid 120 mL into the Bakri balloon.  After 3-5 minutes of watching the balloon, no active bleeding was noted through the catheter.  Upon reinspection and palpation, the  Bakri balloon was felt to be only partially in the uterus and, therefore, the fluid was removed and the Bakri balloon was removed.  No further bleeding was identified; therefore, the Bakri balloon was not reinserted.  The cervix and vagina were monitored  for the next 10 minutes.  No further active bleeding was identified.  Sponge, lap and needle count was correct.  The patient was taken to the recovery room in stable condition.  LN/NUANCE  D:11/04/2017 T:11/04/2017 JOB:[id number]

## 2017-11-04 NOTE — Anesthesia Postprocedure Evaluation (Signed)
Anesthesia Post Note  Patient: Desiree Garza  Procedure(s) Performed: DILATATION AND EVACUATION (N/A Vagina )     Patient location during evaluation: Mother Baby Anesthesia Type: Epidural Level of consciousness: awake and alert and oriented Pain management: pain level controlled Vital Signs Assessment: post-procedure vital signs reviewed and stable Respiratory status: spontaneous breathing, nonlabored ventilation and respiratory function stable Cardiovascular status: stable Postop Assessment: no headache, no backache, epidural receding, patient able to bend at knees, no apparent nausea or vomiting and adequate PO intake Anesthetic complications: no    Last Vitals:  Vitals:   11/04/17 1353 11/04/17 1524  BP: 121/71 110/79  Pulse: 78 88  Resp: 18 20  Temp: 37.1 C 36.7 C  SpO2: 100% 98%    Last Pain:  Vitals:   11/04/17 1701  TempSrc:   PainSc: 0-No pain   Pain Goal:                 Rajah Lamba

## 2017-11-05 LAB — KLEIHAUER-BETKE STAIN
# Vials RhIg: 1
Fetal Cells %: 0 %
QUANTITATION FETAL HEMOGLOBIN: 0 mL

## 2017-11-05 LAB — CBC
HEMATOCRIT: 19.1 % — AB (ref 36.0–46.0)
Hemoglobin: 6.6 g/dL — CL (ref 12.0–15.0)
MCH: 32.7 pg (ref 26.0–34.0)
MCHC: 34.6 g/dL (ref 30.0–36.0)
MCV: 94.6 fL (ref 78.0–100.0)
Platelets: 133 10*3/uL — ABNORMAL LOW (ref 150–400)
RBC: 2.02 MIL/uL — ABNORMAL LOW (ref 3.87–5.11)
RDW: 13.4 % (ref 11.5–15.5)
WBC: 9.6 10*3/uL (ref 4.0–10.5)

## 2017-11-05 LAB — BIRTH TISSUE RECOVERY COLLECTION (PLACENTA DONATION)

## 2017-11-05 MED ORDER — POLYSACCHARIDE IRON COMPLEX 150 MG PO CAPS
150.0000 mg | ORAL_CAPSULE | Freq: Every day | ORAL | Status: DC
  Administered 2017-11-05: 150 mg via ORAL
  Filled 2017-11-05: qty 1

## 2017-11-05 MED ORDER — RHO D IMMUNE GLOBULIN 1500 UNIT/2ML IJ SOSY
300.0000 ug | PREFILLED_SYRINGE | Freq: Once | INTRAMUSCULAR | Status: AC
  Administered 2017-11-05: 300 ug via INTRAVENOUS
  Filled 2017-11-05: qty 2

## 2017-11-05 NOTE — Progress Notes (Signed)
Per MD ok for epidural to be removed.

## 2017-11-05 NOTE — Progress Notes (Signed)
Post Partum Day 1  POD 1 (D&C) Subjective: no complaints, up ad lib, voiding, tolerating PO and minimal bleeding and no lightheadness or dizziness  Objective: Blood pressure (!) 90/52, pulse 67, temperature 99 F (37.2 C), temperature source Oral, resp. rate 16, height  (1.6 m), weight 156 lb (70.8 kg), last menstrual period 02/04/2017, SpO2 98 %, unknown if currently breastfeeding.  Physical Exam:  General: alert, cooperative, appears stated age and no distress Lochia: appropriate Uterine Fundus: firm Incision: healing well DVT Evaluation: No evidence of DVT seen on physical exam.  Recent Labs    11/04/17 1133 11/05/17 0527  HGB 8.5* 6.6*  HCT 24.8* 19.1*    Assessment/Plan: Plan for discharge tomorrow and Breastfeeding  PP atony - s/p d&C.  Stable but anemic.  Recheck CBC in am   LOS: 1 day   Terell Kincy C 11/05/2017, 9:13 AM

## 2017-11-05 NOTE — Progress Notes (Signed)
Called Dr. Renaldo Fiddler with critical lab value. Pt hemoglobin 6.6. Pt is asymptomatic. Blood pressure and heart rate remaining in normal range for pt. Dr. Renaldo Fiddler is not placing any new orders at this time and states they will talk to pt this morning in rounds.

## 2017-11-06 LAB — CBC
HEMATOCRIT: 19.8 % — AB (ref 36.0–46.0)
HEMOGLOBIN: 6.6 g/dL — AB (ref 12.0–15.0)
MCH: 32.8 pg (ref 26.0–34.0)
MCHC: 33.3 g/dL (ref 30.0–36.0)
MCV: 98.5 fL (ref 78.0–100.0)
Platelets: 133 10*3/uL — ABNORMAL LOW (ref 150–400)
RBC: 2.01 MIL/uL — ABNORMAL LOW (ref 3.87–5.11)
RDW: 13.8 % (ref 11.5–15.5)
WBC: 7.3 10*3/uL (ref 4.0–10.5)

## 2017-11-06 LAB — RH IG WORKUP (INCLUDES ABO/RH)
ABO/RH(D): A NEG
GESTATIONAL AGE(WKS): 39
UNIT DIVISION: 0

## 2017-11-06 MED ORDER — SODIUM CHLORIDE 0.9 % IV SOLN
510.0000 mg | Freq: Once | INTRAVENOUS | Status: AC
  Administered 2017-11-06: 510 mg via INTRAVENOUS
  Filled 2017-11-06: qty 17

## 2017-11-06 NOTE — Discharge Summary (Signed)
Obstetric Discharge Summary Reason for Admission: induction of labor Prenatal Procedures: none Intrapartum Procedures: spontaneous vaginal delivery and uterine exploration and curettage Postpartum Procedures: curettage Complications-Operative and Postpartum: none and hemorrhage Hemoglobin  Date Value Ref Range Status  11/06/2017 6.6 (LL) 12.0 - 15.0 g/dL Final    Comment:    REPEATED TO VERIFY CRITICAL RESULT CALLED TO, READ BACK BY AND VERIFIED WITH: MEARS, L AT 1027 ON 11/06/17 BY SAINVILUS, S.    HCT  Date Value Ref Range Status  11/06/2017 19.8 (L) 36.0 - 46.0 % Final    Physical Exam:  General: alert, cooperative and appears stated age 37: appropriate Uterine Fundus: firm Incision: healing well, no significant drainage, no dehiscence DVT Evaluation: No evidence of DVT seen on physical exam.  Discharge Diagnoses: Term Pregnancy-delivered  Discharge Information: Date: 11/06/2017 Activity: pelvic rest Diet: routine Medications: None Condition: improved Instructions: refer to practice specific booklet Discharge to: home   Newborn Data: Live born female  Birth Weight: 7 lb 3 oz (3260 g) APGAR: 9, 9  Newborn Delivery   Birth date/time:  11/04/2017 11:10:00 Delivery type:  Vaginal, Spontaneous     Home with mother.  Desiree Garza L 11/06/2017, 9:49 AM

## 2017-11-08 LAB — TYPE AND SCREEN
ABO/RH(D): A NEG
Antibody Screen: NEGATIVE
Unit division: 0
Unit division: 0

## 2017-11-08 LAB — BPAM RBC
Blood Product Expiration Date: 201906302359
Blood Product Expiration Date: 201906302359
Unit Type and Rh: 600
Unit Type and Rh: 600

## 2017-11-11 ENCOUNTER — Inpatient Hospital Stay (HOSPITAL_COMMUNITY): Admission: AD | Admit: 2017-11-11 | Payer: 59 | Source: Ambulatory Visit | Admitting: Obstetrics and Gynecology

## 2018-06-23 ENCOUNTER — Ambulatory Visit: Payer: 59 | Admitting: Family Medicine

## 2018-06-24 ENCOUNTER — Ambulatory Visit (INDEPENDENT_AMBULATORY_CARE_PROVIDER_SITE_OTHER): Payer: 59 | Admitting: Family Medicine

## 2018-06-24 ENCOUNTER — Encounter: Payer: Self-pay | Admitting: Family Medicine

## 2018-06-24 VITALS — BP 104/76 | HR 67 | Temp 98.8°F | Resp 16 | Ht 63.0 in | Wt 99.6 lb

## 2018-06-24 DIAGNOSIS — Z0000 Encounter for general adult medical examination without abnormal findings: Secondary | ICD-10-CM

## 2018-06-24 LAB — COMPREHENSIVE METABOLIC PANEL
ALBUMIN: 4.6 g/dL (ref 3.5–5.2)
ALK PHOS: 50 U/L (ref 39–117)
ALT: 20 U/L (ref 0–35)
AST: 16 U/L (ref 0–37)
BILIRUBIN TOTAL: 1 mg/dL (ref 0.2–1.2)
BUN: 16 mg/dL (ref 6–23)
CALCIUM: 9.1 mg/dL (ref 8.4–10.5)
CO2: 29 meq/L (ref 19–32)
CREATININE: 0.64 mg/dL (ref 0.40–1.20)
Chloride: 104 mEq/L (ref 96–112)
GFR: 112.2 mL/min (ref >60.00)
Glucose, Bld: 77 mg/dL (ref 70–99)
Potassium: 3.8 mEq/L (ref 3.5–5.1)
Sodium: 140 mEq/L (ref 135–145)
Total Protein: 6.7 g/dL (ref 6.0–8.3)

## 2018-06-24 LAB — LIPID PANEL
CHOLESTEROL: 167 mg/dL (ref 0–200)
HDL: 76.1 mg/dL (ref >39.00)
LDL Cholesterol: 82 mg/dL (ref 0–99)
NonHDL: 90.68
TRIGLYCERIDES: 42 mg/dL (ref 0.0–149.0)
Total CHOL/HDL Ratio: 2
VLDL: 8.4 mg/dL (ref 0.0–40.0)

## 2018-06-24 LAB — CBC WITH DIFFERENTIAL/PLATELET
BASOS ABS: 0 10*3/uL (ref 0.0–0.1)
BASOS PCT: 0.5 % (ref 0.0–3.0)
EOS ABS: 0.1 10*3/uL (ref 0.0–0.7)
Eosinophils Relative: 2.5 % (ref 0.0–5.0)
HEMATOCRIT: 36 % (ref 36.0–46.0)
HEMOGLOBIN: 12.4 g/dL (ref 12.0–15.0)
LYMPHS PCT: 40.8 % (ref 12.0–46.0)
Lymphs Abs: 1.6 10*3/uL (ref 0.7–4.0)
MCHC: 34.6 g/dL (ref 30.0–36.0)
MCV: 89.3 fl (ref 78.0–100.0)
MONOS PCT: 7.7 % (ref 3.0–12.0)
Monocytes Absolute: 0.3 10*3/uL (ref 0.1–1.0)
NEUTROS ABS: 1.9 10*3/uL (ref 1.4–7.7)
Neutrophils Relative %: 48.5 % (ref 43.0–77.0)
PLATELETS: 181 10*3/uL (ref 150.0–400.0)
RBC: 4.02 Mil/uL (ref 3.87–5.11)
RDW: 12.2 % (ref 11.5–15.5)
WBC: 3.9 10*3/uL — ABNORMAL LOW (ref 4.0–10.5)

## 2018-06-24 NOTE — Patient Instructions (Signed)
Please return in 12 months for your annual complete physical; please come fasting.  I will release your lab results to you on your MyChart account with further instructions. Please reply with any questions.    It was a pleasure meeting you today! Thank you for choosing us to meet your healthcare needs! I truly look forward to working with you. If you have any questions or concerns, please send me a message via Mychart or call the office at [phone].  Please do these things to maintain good health!   Exercise at least 30-45 minutes a day,  4-5 days a week.   Eat a low-fat diet with lots of fruits and vegetables, up to 7-9 servings per day.  Drink plenty of water daily. Try to drink 8 8oz glasses per day.  Seatbelts can save your life. Always wear your seatbelt.  Place Smoke Detectors on every level of your home and check batteries every year.  Schedule an appointment with an eye doctor for an eye exam every 1-2 years  Safe sex - use condoms to protect yourself from STDs if you could be exposed to these types of infections. Use birth control if you do not want to become pregnant and are sexually active.  Avoid heavy alcohol use. If you drink, keep it to less than 2 drinks/day and not every day.  Health Care Power of Attorney.  Choose someone you trust that could speak for you if you became unable to speak for yourself.  Depression is common in our stressful world.If you're feeling down or losing interest in things you normally enjoy, please come in for a visit.  If anyone is threatening or hurting you, please get help. Physical or Emotional Violence is never OK.  

## 2018-06-24 NOTE — Progress Notes (Signed)
Subjective  CC:  Chief Complaint  Patient presents with  . Establish Care  . Annual Exam    HPI: Desiree Garza is a 38 y.o. female who presents to [facility] at [facility] today to establish care with me as a new patient.   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  HM: pap up to date. imms up to date.   Assessment  1. Annual physical exam      Plan  Female Wellness Visit:  Age appropriate Health Maintenance and Prevention measures were discussed with patient. Included topics are cancer screening recommendations, ways to keep healthy (see AVS) including dietary and exercise recommendations, regular eye and dental care, use of seat belts, and avoidance of moderate alcohol use and tobacco use.   BMI: discussed patient's BMI and encouraged positive lifestyle modifications to help get to or maintain a target BMI.  HM needs and immunizations were addressed and ordered. See below for orders. See HM and immunization section for updates.  Routine labs and screening tests ordered including cmp, cbc and lipids where appropriate.  Discussed recommendations regarding Vit D and calcium supplementation (see AVS)    Follow up:  Return in about 1 year (around 06/25/2019) for complete physical. Orders Placed This Encounter  Procedures  . CBC with Differential/Platelet  . Comprehensive metabolic panel  . Lipid panel   No orders of the defined types were placed in this encounter.    Depression screen PHQ 2/9 06/24/2018  Decreased Interest 0  Down, Depressed, Hopeless 0  PHQ - 2 Score 0    We updated and reviewed the patient's past history in detail and it is documented below.  There are no active problems to display for this patient.  Health Maintenance  Topic Date Due  . PAP SMEAR-Modifier  05/24/2021  . TETANUS/TDAP  02/24/2028  . INFLUENZA VACCINE  Completed  . HIV Screening  Completed    Immunization History  Administered Date(s) Administered  . Influenza Inj Mdck Quad Pf 03/21/2018  . Influenza Split 05/09/2010  . Rho (D) Immune Globulin 02/03/2013  . Td 02/06/2006   Current Meds  Medication Sig  . Linoleic Acid-Sunflower Oil [phone] MG CAPS Take by mouth.  . Prenatal Vit-Fe Fumarate-FA (PRENATAL MULTIVITAMIN) TABS tablet Take 1 tablet by mouth daily.     Allergies: Patient is allergic to latex. Past Medical History Patient  has a past medical history of Anemia, Cervical dysplasia, History of cryosurgery, PONV (postoperative nausea and vomiting), PPH (postpartum hemorrhage) (11/04/2017 and 01/2013), and Vacuum extractor delivery, delivered (07/07/2015). Past Surgical History Patient  has a past surgical history that includes Gynecologic cryosurgery (2012); Wisdom tooth extraction (2006); Dilation and evacuation (N/A, 09/24/2016); urethrea stretching (1991); Dilation and evacuation (N/A, 11/05/2016); and Dilation and evacuation (N/A, 11/04/2017). Family History: Patient family history includes Colon cancer in her maternal grandmother; Early death in her son; Healthy in her son, son, and son; Hypertension in her father and mother; Lung cancer in her maternal grandfather; Stroke in her paternal grandmother; Testicular cancer in her father; Thyroid disease in her maternal grandmother. Social History:  Patient  reports that she has never smoked. She has never used smokeless tobacco. She reports current alcohol use of about 1.0 standard drinks of alcohol per week. She reports that she does not use drugs.  Review of Systems: Constitutional: negative for fever or malaise Ophthalmic: negative for photophobia, double vision or loss of vision  Cardiovascular: negative for chest pain, dyspnea on exertion, or new LE swelling Respiratory: negative for SOB or persistent cough Gastrointestinal: negative for abdominal pain, change in bowel habits or melena Genitourinary: negative for  dysuria or gross hematuria Musculoskeletal: negative for new gait disturbance or muscular weakness Integumentary: negative for new or persistent rashes Neurological: negative for TIA or stroke symptoms Psychiatric: negative for SI or delusions Allergic/Immunologic: negative for hives  Patient Care Team    Relationship Specialty Notifications Start End  Willow Ora, MD PCP - General Family Medicine  06/24/18   Marcelle Overlie, MD Consulting Physician Obstetrics and Gynecology  06/24/18   Elmon Else, MD Consulting Physician Dermatology  06/24/18   Optical, Red I    06/24/18   Marchia Bond, DMD  Dentistry  06/24/18     Objective  Vitals: BP 104/76   Pulse 67   Temp 98.8 F (37.1 C) (Oral)   Resp 16   Ht 5\' 3"  (1.6 m)   Wt 99 lb 9.6 oz (45.2 kg)   SpO2 98%   Breastfeeding Yes   BMI 17.64 kg/m  General:  Well developed, well nourished, no acute distress  Psych:  Alert and oriented,normal mood and affect HEENT:  Normocephalic, atraumatic, non-icteric sclera, PERRL, oropharynx is without mass or exudate, supple neck without adenopathy, mass or thyromegaly Cardiovascular:  RRR without gallop, rub or murmur, nondisplaced PMI Respiratory:  Good breath sounds bilaterally, CTAB with normal respiratory effort Gastrointestinal: normal bowel sounds, soft, non-tender, no noted masses. No HSM MSK: no deformities, contusions. Joints are without erythema or swelling Skin:  Warm, no rashes or suspicious lesions noted Neurologic:    Mental status is normal. Gross motor and sensory exams are normal. Normal gait   Commons side effects, risks, benefits, and alternatives for medications and treatment plan prescribed today were discussed, and the patient expressed understanding of the given instructions. Patient is instructed to call or message via MyChart if he/she has any questions or concerns regarding our treatment plan. No barriers to understanding were identified. We discussed Red Flag symptoms  and signs in detail. Patient expressed understanding regarding what to do in case of urgent or emergency type symptoms.   Medication list was reconciled, printed and provided to the patient in AVS. Patient instructions and summary information was reviewed with the patient as documented in the AVS. This note was prepared with assistance of Dragon voice recognition software. Occasional wrong-word or sound-a-like substitutions may have occurred due to the inherent limitations of voice recognition software

## 2018-12-21 ENCOUNTER — Telehealth: Payer: Self-pay | Admitting: Family Medicine

## 2018-12-21 NOTE — Telephone Encounter (Signed)
Called pt and gave her the information.

## 2018-12-21 NOTE — Telephone Encounter (Signed)
Patient is coming for an office visit and she has not met her dectuable and needing lab. She is asking how much an office visit would be. Advised her to check with her insurance. She stated that they are going to direct her back to the office. Please advise CB [phone]

## 2018-12-21 NOTE — Telephone Encounter (Signed)
We are unable to tell her rates as insurance has that information. The beginning rate possibly would be around $260 just for the visit, not including labs (we do not have these rates) and what is done during the visit.

## 2018-12-23 ENCOUNTER — Telehealth: Payer: Self-pay

## 2018-12-23 NOTE — Telephone Encounter (Signed)
Called pt, no answer, LMOVM to return call or send mychart message

## 2018-12-23 NOTE — Telephone Encounter (Signed)
Copied from [place] [id number]. Topic: General - Other >> Dec 23, 2018 11:11 AM Rainey Pines A wrote: Patient wants a callback from nurse if she can get Dr .Jonni Sanger to complete a form in regards to her physical on 06/2018 for her adoption home study.

## 2018-12-23 NOTE — Telephone Encounter (Signed)
Called pt she was given fax number to send papers. She is also aware we will call her if we can or can not complete form.

## 2018-12-23 NOTE — Telephone Encounter (Signed)
Pt is returning call, please call back

## 2018-12-24 ENCOUNTER — Ambulatory Visit: Payer: 59 | Admitting: Family Medicine

## 2018-12-29 ENCOUNTER — Encounter: Payer: Self-pay | Admitting: *Deleted

## 2019-01-11 ENCOUNTER — Encounter: Payer: Self-pay | Admitting: Family Medicine

## 2019-01-14 ENCOUNTER — Encounter: Payer: Self-pay | Admitting: Family Medicine

## 2019-02-01 ENCOUNTER — Encounter: Payer: Self-pay | Admitting: Family Medicine

## 2019-02-11 ENCOUNTER — Encounter: Payer: Self-pay | Admitting: Family Medicine

## 2019-02-17 ENCOUNTER — Encounter: Payer: Self-pay | Admitting: *Deleted

## 2019-06-28 ENCOUNTER — Other Ambulatory Visit: Payer: Self-pay

## 2019-06-29 ENCOUNTER — Encounter: Payer: Self-pay | Admitting: Family Medicine

## 2019-06-29 ENCOUNTER — Ambulatory Visit (INDEPENDENT_AMBULATORY_CARE_PROVIDER_SITE_OTHER): Payer: 59 | Admitting: Family Medicine

## 2019-06-29 VITALS — BP 110/72 | HR 67 | Temp 98.1°F | Ht 63.0 in | Wt 95.4 lb

## 2019-06-29 DIAGNOSIS — Z0000 Encounter for general adult medical examination without abnormal findings: Secondary | ICD-10-CM | POA: Diagnosis not present

## 2019-06-29 LAB — CBC WITH DIFFERENTIAL/PLATELET
Basophils Absolute: 0 10*3/uL (ref 0.0–0.1)
Basophils Relative: 0.8 % (ref 0.0–3.0)
Eosinophils Absolute: 0.1 10*3/uL (ref 0.0–0.7)
Eosinophils Relative: 1.8 % (ref 0.0–5.0)
HCT: 37.2 % (ref 36.0–46.0)
Hemoglobin: 12.6 g/dL (ref 12.0–15.0)
Lymphocytes Relative: 36.8 % (ref 12.0–46.0)
Lymphs Abs: 1.3 10*3/uL (ref 0.7–4.0)
MCHC: 33.8 g/dL (ref 30.0–36.0)
MCV: 90.7 fl (ref 78.0–100.0)
Monocytes Absolute: 0.3 10*3/uL (ref 0.1–1.0)
Monocytes Relative: 9.5 % (ref 3.0–12.0)
Neutro Abs: 1.8 10*3/uL (ref 1.4–7.7)
Neutrophils Relative %: 51.1 % (ref 43.0–77.0)
Platelets: 180 10*3/uL (ref 150.0–400.0)
RBC: 4.1 Mil/uL (ref 3.87–5.11)
RDW: 12.7 % (ref 11.5–15.5)
WBC: 3.5 10*3/uL — ABNORMAL LOW (ref 4.0–10.5)

## 2019-06-29 LAB — COMPREHENSIVE METABOLIC PANEL
ALT: 12 U/L (ref 0–35)
AST: 14 U/L (ref 0–37)
Albumin: 4.3 g/dL (ref 3.5–5.2)
Alkaline Phosphatase: 26 U/L — ABNORMAL LOW (ref 39–117)
BUN: 14 mg/dL (ref 6–23)
CO2: 26 mEq/L (ref 19–32)
Calcium: 8.6 mg/dL (ref 8.4–10.5)
Chloride: 104 mEq/L (ref 96–112)
Creatinine, Ser: 0.66 mg/dL (ref 0.40–1.20)
GFR: 101.29 mL/min (ref >60.00)
Glucose, Bld: 73 mg/dL (ref 70–99)
Potassium: 3.8 mEq/L (ref 3.5–5.1)
Sodium: 137 mEq/L (ref 135–145)
Total Bilirubin: 1.3 mg/dL — ABNORMAL HIGH (ref 0.2–1.2)
Total Protein: 6.6 g/dL (ref 6.0–8.3)

## 2019-06-29 NOTE — Progress Notes (Signed)
Subjective  Chief Complaint  Patient presents with  . Annual Exam    HPI: Desiree Garza is a 39 y.o. female who presents to [facility] at [place] today for a Female Wellness Visit.   Wellness Visit: annual visit with health maintenance review and exam without Pap   HM: very healthy mother of 3, active, no concerns. Saw Dr. Helane Rima last week.   Assessment  1. Annual physical exam      Plan  Female Wellness Visit:  Age appropriate Health Maintenance and Prevention measures were discussed with patient. Included topics are cancer screening recommendations, ways to keep healthy (see AVS) including dietary and exercise recommendations, regular eye and dental care, use of seat belts, and avoidance of moderate alcohol use and tobacco use.   BMI: discussed patient's BMI and encouraged positive lifestyle modifications to help get to or maintain a target BMI.  HM needs and immunizations were addressed and ordered. See below for orders. See HM and immunization section for updates.  Routine labs and screening tests ordered including cmp, cbc and lipids where appropriate.  Discussed recommendations regarding Vit D and calcium supplementation (see AVS)  Follow up: Return in about 1 year (around 06/28/2020) for complete physical.   Orders Placed This Encounter  Procedures  . CBC with Differential/Platelet  . Comprehensive metabolic panel   No orders of the defined types were placed in this encounter.     Lifestyle: Body mass index is 16.9 kg/m. Wt Readings from Last 3 Encounters:  06/29/19 95 lb 6.4 oz (43.3 kg)  06/24/18 99 lb 9.6 oz (45.2 kg)  11/04/17 156 lb (70.8 kg)    There are no problems to display for this patient.  Health Maintenance  Topic Date Due  . PAP SMEAR-Modifier  05/24/2021  . TETANUS/TDAP  02/24/2028  . INFLUENZA VACCINE  Completed  . HIV Screening  Completed   Immunization History  Administered Date(s) Administered  . Influenza Inj  Mdck Quad Pf 03/21/2018  . Influenza Split 05/09/2010  . Influenza,inj,Quad PF,6+ Mos 05/09/2010, 03/21/2018, 04/09/2019  . PPD Test 01/19/2019  . Rho (D) Immune Globulin 02/03/2013  . Td 02/06/2006   We updated and reviewed the patient's past history in detail and it is documented below. Allergies: Patient is allergic to latex. Past Medical History Patient  has a past medical history of Anemia, Cervical dysplasia, History of cryosurgery, PONV (postoperative nausea and vomiting), PPH (postpartum hemorrhage) (11/04/2017 and 01/2013), and Vacuum extractor delivery, delivered (07/07/2015). Past Surgical History Patient  has a past surgical history that includes Gynecologic cryosurgery (2012); Wisdom tooth extraction (2006); Dilation and evacuation (N/A, 09/24/2016); urethrea stretching (1991); Dilation and evacuation (N/A, 11/05/2016); and Dilation and evacuation (N/A, 11/04/2017). Family History: Patient family history includes Arthritis in her maternal grandfather, maternal grandmother, and paternal grandmother; Colon cancer in her maternal grandmother; Depression in her maternal grandmother; Diabetes in her maternal grandmother; Early death in her son; Healthy in her son, son, and son; High Cholesterol in her father, maternal grandmother, and mother; High blood pressure in her maternal grandmother and paternal grandmother; Hypertension in her father and mother; Kidney disease in her maternal grandfather; Lung cancer in her maternal grandfather; Miscarriages / [place] in her mother; Stroke in her paternal grandmother; Testicular cancer in her father; Thyroid disease in her maternal grandmother. Social History:  Patient  reports that she has never smoked. She has never used smokeless tobacco. She reports current alcohol use of about 1.0 standard drinks of alcohol per week.  She reports that she does not use drugs.  Review of Systems: Constitutional: negative for fever or malaise Ophthalmic: negative  for photophobia, double vision or loss of vision Cardiovascular: negative for chest pain, dyspnea on exertion, or new LE swelling Respiratory: negative for SOB or persistent cough Gastrointestinal: negative for abdominal pain, change in bowel habits or melena Genitourinary: negative for dysuria or gross hematuria, no abnormal uterine bleeding or disharge Musculoskeletal: negative for new gait disturbance or muscular weakness Integumentary: negative for new or persistent rashes, no breast lumps Neurological: negative for TIA or stroke symptoms Psychiatric: negative for SI or delusions Allergic/Immunologic: negative for hives Patient Care Team    Relationship Specialty Notifications Start End  Willow Ora, MD PCP - General Family Medicine  06/24/18   Marcelle Overlie, MD Consulting Physician Obstetrics and Gynecology  06/24/18   Elmon Else, MD Consulting Physician Dermatology  06/24/18   Optical, Red I    06/24/18   Marchia Bond, DMD  Dentistry  06/24/18     Objective  Vitals: BP 110/72 (BP Location: Right Arm, Patient Position: Sitting, Cuff Size: Normal)   Pulse 67   Temp 98.1 F (36.7 C) (Temporal)   Ht 5\' 3"  (1.6 m)   Wt 95 lb 6.4 oz (43.3 kg)   LMP 06/11/2019   SpO2 99%   BMI 16.90 kg/m  General:  Well developed, well nourished, no acute distress  Psych:  Alert and orientedx3,normal mood and affect HEENT:  Normocephalic, atraumatic, non-icteric sclera, PERRL, oropharynx is clear without mass or exudate, supple neck without adenopathy, mass or thyromegaly Cardiovascular:  Normal S1, S2, RRR without gallop, rub or murmur, nondisplaced PMI Respiratory:  Good breath sounds bilaterally, CTAB with normal respiratory effort Gastrointestinal: normal bowel sounds, soft, non-tender, no noted masses. No HSM MSK: no deformities, contusions. Joints are without erythema or swelling. Spine and CVA region are nontender Skin:  Warm, no rashes or suspicious lesions noted Neurologic:    Mental  status is normal. CN 2-11 are normal. Gross motor and sensory exams are normal. Normal gait. No tremor    Commons side effects, risks, benefits, and alternatives for medications and treatment plan prescribed today were discussed, and the patient expressed understanding of the given instructions. Patient is instructed to call or message via MyChart if he/she has any questions or concerns regarding our treatment plan. No barriers to understanding were identified. We discussed Red Flag symptoms and signs in detail. Patient expressed understanding regarding what to do in case of urgent or emergency type symptoms.   Medication list was reconciled, printed and provided to the patient in AVS. Patient instructions and summary information was reviewed with the patient as documented in the AVS. This note was prepared with assistance of Dragon voice recognition software. Occasional wrong-word or sound-a-like substitutions may have occurred due to the inherent limitations of voice recognition software  This visit occurred during the SARS-CoV-2 public health emergency.  Safety protocols were in place, including screening questions prior to the visit, additional usage of staff PPE, and extensive cleaning of exam room while observing appropriate contact time as indicated for disinfecting solutions.

## 2019-06-29 NOTE — Patient Instructions (Signed)
Please return in 12 months for your annual complete physical; please come fasting.  I will release your lab results to you on your MyChart account with further instructions. Please reply with any questions.   If you have any questions or concerns, please don't hesitate to send me a message via MyChart or call the office at [phone]. Thank you for visiting with us today! It's our pleasure caring for you.   Preventive Care 21-37 Years Old, Female Preventive care refers to visits with your health care provider and lifestyle choices that can promote health and wellness. This includes:  A yearly physical exam. This may also be called an annual well check.  Regular dental visits and eye exams.  Immunizations.  Screening for certain conditions.  Healthy lifestyle choices, such as eating a healthy diet, getting regular exercise, not using drugs or products that contain nicotine and tobacco, and limiting alcohol use. What can I expect for my preventive care visit? Physical exam Your health care provider will check your:  Height and weight. This may be used to calculate body mass index (BMI), which tells if you are at a healthy weight.  Heart rate and blood pressure.  Skin for abnormal spots. Counseling Your health care provider may ask you questions about your:  Alcohol, tobacco, and drug use.  Emotional well-being.  Home and relationship well-being.  Sexual activity.  Eating habits.  Work and work environment.  Method of birth control.  Menstrual cycle.  Pregnancy history. What immunizations do I need?  Influenza (flu) vaccine  This is recommended every year. Tetanus, diphtheria, and pertussis (Tdap) vaccine  You may need a Td booster every 10 years. Varicella (chickenpox) vaccine  You may need this if you have not been vaccinated. Human papillomavirus (HPV) vaccine  If recommended by your health care provider, you may need three doses over 6 months. Measles,  mumps, and rubella (MMR) vaccine  You may need at least one dose of MMR. You may also need a second dose. Meningococcal conjugate (MenACWY) vaccine  One dose is recommended if you are age 19-21 years and a first-year college student living in a residence hall, or if you have one of several medical conditions. You may also need additional booster doses. Pneumococcal conjugate (PCV13) vaccine  You may need this if you have certain conditions and were not previously vaccinated. Pneumococcal polysaccharide (PPSV23) vaccine  You may need one or two doses if you smoke cigarettes or if you have certain conditions. Hepatitis A vaccine  You may need this if you have certain conditions or if you travel or work in places where you may be exposed to hepatitis A. Hepatitis B vaccine  You may need this if you have certain conditions or if you travel or work in places where you may be exposed to hepatitis B. Haemophilus influenzae type b (Hib) vaccine  You may need this if you have certain conditions. You may receive vaccines as individual doses or as more than one vaccine together in one shot (combination vaccines). Talk with your health care provider about the risks and benefits of combination vaccines. What tests do I need?  Blood tests  Lipid and cholesterol levels. These may be checked every 5 years starting at age 20.  Hepatitis C test.  Hepatitis B test. Screening  Diabetes screening. This is done by checking your blood sugar (glucose) after you have not eaten for a while (fasting).  Sexually transmitted disease (STD) testing.  BRCA-related cancer screening. This may be   done if you have a family history of breast, ovarian, tubal, or peritoneal cancers.  Pelvic exam and Pap test. This may be done every 3 years starting at age 21. Starting at age 30, this may be done every 5 years if you have a Pap test in combination with an HPV test. Talk with your health care provider about your test  results, treatment options, and if necessary, the need for more tests. Follow these instructions at home: Eating and drinking   Eat a diet that includes fresh fruits and vegetables, whole grains, lean protein, and low-fat dairy.  Take vitamin and mineral supplements as recommended by your health care provider.  Do not drink alcohol if: ? Your health care provider tells you not to drink. ? You are pregnant, may be pregnant, or are planning to become pregnant.  If you drink alcohol: ? Limit how much you have to 0-1 drink a day. ? Be aware of how much alcohol is in your drink. In the [place] one drink equals one 12 oz bottle of beer (355 mL), one 5 oz glass of wine (148 mL), or one 1 oz glass of hard liquor (44 mL). Lifestyle  Take daily care of your teeth and gums.  Stay active. Exercise for at least 30 minutes on 5 or more days each week.  Do not use any products that contain nicotine or tobacco, such as cigarettes, e-cigarettes, and chewing tobacco. If you need help quitting, ask your health care provider.  If you are sexually active, practice safe sex. Use a condom or other form of birth control (contraception) in order to prevent pregnancy and STIs (sexually transmitted infections). If you plan to become pregnant, see your health care provider for a preconception visit. What's next?  Visit your health care provider once a year for a well check visit.  Ask your health care provider how often you should have your eyes and teeth checked.  Stay up to date on all vaccines. This information is not intended to replace advice given to you by your health care provider. Make sure you discuss any questions you have with your health care provider. Document Revised: 02/05/2018 Document Reviewed: 02/05/2018 Elsevier Patient Education  2020 [employer]   

## 2019-09-24 ENCOUNTER — Ambulatory Visit: Payer: 59 | Attending: Internal Medicine

## 2019-09-24 DIAGNOSIS — Z23 Encounter for immunization: Secondary | ICD-10-CM

## 2019-09-24 NOTE — Progress Notes (Signed)
   Covid-19 Vaccination Clinic  Name:  Desiree Garza    MRN: [id number] DOB: 1981-03-07  09/24/2019  Desiree Garza was observed post Covid-19 immunization for 15 minutes without incident. She was provided with Vaccine Information Sheet and instruction to access the V-Safe system.   Desiree Garza was instructed to call 911 with any severe reactions post vaccine: Marland Kitchen Difficulty breathing  . Swelling of face and throat  . A fast heartbeat  . A bad rash all over body  . Dizziness and weakness   Immunizations Administered    Name Date Dose VIS Date Route   Pfizer COVID-19 Vaccine 09/24/2019  9:55 AM 0.3 mL 05/21/2019 Intramuscular   Manufacturer: [employer], [employer]   Lot: [id number]   NDC: [address]-0

## 2019-10-18 ENCOUNTER — Ambulatory Visit: Payer: 59 | Attending: Internal Medicine

## 2019-10-18 DIAGNOSIS — Z23 Encounter for immunization: Secondary | ICD-10-CM

## 2019-10-18 NOTE — Progress Notes (Signed)
   Covid-19 Vaccination Clinic  Name:  RETIA CORDLE    MRN: [id number] DOB: 1981-02-11  10/18/2019  Ms. Matuska was observed post Covid-19 immunization for 15 minutes without incident. She was provided with Vaccine Information Sheet and instruction to access the V-Safe system.   Ms. Lint was instructed to call 911 with any severe reactions post vaccine: Marland Kitchen Difficulty breathing  . Swelling of face and throat  . A fast heartbeat  . A bad rash all over body  . Dizziness and weakness   Immunizations Administered    Name Date Dose VIS Date Route   Pfizer COVID-19 Vaccine 10/18/2019  9:51 AM 0.3 mL 08/04/2018 Intramuscular   Manufacturer: [employer], [employer]   Lot: [id number]   NDC: [address]-9

## 2020-04-14 ENCOUNTER — Other Ambulatory Visit: Payer: Self-pay | Admitting: Obstetrics and Gynecology

## 2020-04-14 DIAGNOSIS — N644 Mastodynia: Secondary | ICD-10-CM

## 2020-04-28 ENCOUNTER — Other Ambulatory Visit: Payer: Self-pay

## 2020-04-28 ENCOUNTER — Ambulatory Visit
Admission: RE | Admit: 2020-04-28 | Discharge: 2020-04-28 | Disposition: A | Discharge status: Home / Self Care | Payer: 59 | Source: Ambulatory Visit | Attending: Obstetrics and Gynecology | Admitting: Obstetrics and Gynecology

## 2020-04-28 ENCOUNTER — Other Ambulatory Visit: Payer: Self-pay | Admitting: Obstetrics and Gynecology

## 2020-04-28 DIAGNOSIS — N644 Mastodynia: Secondary | ICD-10-CM

## 2020-05-12 ENCOUNTER — Other Ambulatory Visit: Payer: 59

## 2020-06-29 ENCOUNTER — Other Ambulatory Visit: Payer: Self-pay

## 2020-06-29 ENCOUNTER — Ambulatory Visit (INDEPENDENT_AMBULATORY_CARE_PROVIDER_SITE_OTHER): Payer: 59 | Admitting: Family Medicine

## 2020-06-29 VITALS — BP 110/78 | HR 89 | Temp 98.1°F | Wt 95.6 lb

## 2020-06-29 DIAGNOSIS — Z0000 Encounter for general adult medical examination without abnormal findings: Secondary | ICD-10-CM

## 2020-06-29 LAB — CBC WITH DIFFERENTIAL/PLATELET
Basophils Absolute: 0 10*3/uL (ref 0.0–0.1)
Basophils Relative: 0.8 % (ref 0.0–3.0)
Eosinophils Absolute: 0 10*3/uL (ref 0.0–0.7)
Eosinophils Relative: 0.9 % (ref 0.0–5.0)
HCT: 38.5 % (ref 36.0–46.0)
Hemoglobin: 13 g/dL (ref 12.0–15.0)
Lymphocytes Relative: 32.5 % (ref 12.0–46.0)
Lymphs Abs: 1.2 10*3/uL (ref 0.7–4.0)
MCHC: 33.7 g/dL (ref 30.0–36.0)
MCV: 90.3 fl (ref 78.0–100.0)
Monocytes Absolute: 0.3 10*3/uL (ref 0.1–1.0)
Monocytes Relative: 8.8 % (ref 3.0–12.0)
Neutro Abs: 2.1 10*3/uL (ref 1.4–7.7)
Neutrophils Relative %: 57 % (ref 43.0–77.0)
Platelets: 193 10*3/uL (ref 150.0–400.0)
RBC: 4.26 Mil/uL (ref 3.87–5.11)
RDW: 13 % (ref 11.5–15.5)
WBC: 3.7 10*3/uL — ABNORMAL LOW (ref 4.0–10.5)

## 2020-06-29 LAB — COMPREHENSIVE METABOLIC PANEL
ALT: 14 U/L (ref 0–35)
AST: 16 U/L (ref 0–37)
Albumin: 4.9 g/dL (ref 3.5–5.2)
Alkaline Phosphatase: 34 U/L — ABNORMAL LOW (ref 39–117)
BUN: 14 mg/dL (ref 6–23)
CO2: 29 mEq/L (ref 19–32)
Calcium: 9.2 mg/dL (ref 8.4–10.5)
Chloride: 101 mEq/L (ref 96–112)
Creatinine, Ser: 0.68 mg/dL (ref 0.40–1.20)
GFR: 111.53 mL/min (ref >60.00)
Glucose, Bld: 69 mg/dL — ABNORMAL LOW (ref 70–99)
Potassium: 3.5 mEq/L (ref 3.5–5.1)
Sodium: 136 mEq/L (ref 135–145)
Total Bilirubin: 1.5 mg/dL — ABNORMAL HIGH (ref 0.2–1.2)
Total Protein: 7.4 g/dL (ref 6.0–8.3)

## 2020-06-29 LAB — LIPID PANEL
Cholesterol: 157 mg/dL (ref 0–200)
HDL: 69.4 mg/dL (ref >39.00)
LDL Cholesterol: 74 mg/dL (ref 0–99)
NonHDL: 88.07
Total CHOL/HDL Ratio: 2
Triglycerides: 68 mg/dL (ref 0.0–149.0)
VLDL: 13.6 mg/dL (ref 0.0–40.0)

## 2020-06-29 NOTE — Progress Notes (Signed)
Subjective  Chief Complaint  Patient presents with  . Annual Exam    Non-fasting    HPI: Desiree Garza is a 40 y.o. female who presents to [facility] [place] today for a Female Wellness Visit.  She also has the concerns and/or needs as listed above in the chief complaint. These will be addressed in addition to the Health Maintenance Visit.   Wellness Visit: annual visit with health maintenance review and exam without Pap   Health maintenance: Had GYN physical with Pap 2 days ago with Dr. Vincente Poli.  Healthy lifestyle.  Feels well.  Husband had vasectomy.  No concerns  Had COVID and October.  Had very mild symptoms.  Eligible for booster vaccine now.   Assessment  1. Annual physical exam      Plan  Female Wellness Visit:  Age appropriate Health Maintenance and Prevention measures were discussed with patient. Included topics are cancer screening recommendations, ways to keep healthy (see AVS) including dietary and exercise recommendations, regular eye and dental care, use of seat belts, and avoidance of moderate alcohol use and tobacco use.  Screens up-to-date  BMI: discussed patient's BMI and encouraged positive lifestyle modifications to help get to or maintain a target BMI.  HM needs and immunizations were addressed and ordered. See below for orders. See HM and immunization section for updates.  Recommend COVID booster  Routine labs and screening tests ordered including cmp, cbc and lipids where appropriate.  Discussed recommendations regarding Vit D and calcium supplementation (see AVS)  Follow up: We will defer next physical until July 2023 as she sees GYN annually.  Orders Placed This Encounter  Procedures  . CBC with Differential/Platelet  . Comprehensive metabolic panel  . Lipid panel   No orders of the defined types were placed in this encounter.     Lifestyle: Body mass index is 16.93 kg/m. Wt Readings from Last 3 Encounters:  06/29/20 95  lb 9.6 oz (43.4 kg)  06/29/19 95 lb 6.4 oz (43.3 kg)  06/24/18 99 lb 9.6 oz (45.2 kg)     There are no problems to display for this patient.  Health Maintenance  Topic Date Due  . COVID-19 Vaccine (3 - Booster for Pfizer series) 04/19/2020  . PAP SMEAR-Modifier  06/27/2025  . TETANUS/TDAP  02/24/2028  . INFLUENZA VACCINE  Completed  . Hepatitis C Screening  Completed  . HIV Screening  Completed   Immunization History  Administered Date(s) Administered  . Influenza Inj Mdck Quad Pf 03/21/2018  . Influenza Split 05/09/2010, 04/09/2020  . Influenza,inj,Quad PF,6+ Mos 05/09/2010, 03/21/2018, 04/09/2019  . PFIZER(Purple Top)SARS-COV-2 Vaccination 09/24/2019, 10/18/2019  . PPD Test 01/19/2019  . Rho (D) Immune Globulin 02/03/2013  . Td 02/06/2006   We updated and reviewed the patient's past history in detail and it is documented below. Allergies: Patient  reports current alcohol use of about 1.0 standard drink of alcohol per week. Past Medical History Patient  has a past medical history of Anemia, Cervical dysplasia, History of cryosurgery, PONV (postoperative nausea and vomiting), PPH (postpartum hemorrhage) (11/04/2017 and 01/2013), and Vacuum extractor delivery, delivered (07/07/2015). Past Surgical History Patient  has a past surgical history that includes Gynecologic cryosurgery (2012); Wisdom tooth extraction (2006); Dilation and evacuation (N/A, 09/24/2016); urethrea stretching (1991); Dilation and evacuation (N/A, 11/05/2016); and Dilation and evacuation (N/A, 11/04/2017). Social History   Socioeconomic History  . Marital status: Married    Spouse name: Not on file  . Number of children: 3  .  Years of education: Not on file  . Highest education level: Not on file  Occupational History  . Occupation: stay at home mom  . Occupation: former Doctor, general practice  Tobacco Use  . Smoking status: Never Smoker  . Smokeless tobacco: Never Used  Vaping Use  . Vaping Use: Never used   Substance and Sexual Activity  . Alcohol use: Yes    Alcohol/week: 1.0 standard drink    Types: 1 Glasses of wine per week    Comment: prior to pregnancy occasional  . Drug use: No  . Sexual activity: Yes    Birth control/protection: Other-see comments    Comment: husband had vasectomy  Other Topics Concern  . Not on file  Social History Narrative  . Not on file   Social Determinants of Health   Financial Resource Strain: Not on file  Food Insecurity: Not on file  Transportation Needs: Not on file  Physical Activity: Not on file  Stress: Not on file  Social Connections: Not on file   Family History  Problem Relation Age of Onset  . Hypertension Father   . Testicular cancer Father   . High Cholesterol Father   . Hypertension Mother   . High Cholesterol Mother   . Miscarriages / [place] Mother   . Thyroid disease Maternal Grandmother   . Colon cancer Maternal Grandmother   . Arthritis Maternal Grandmother   . Depression Maternal Grandmother   . Diabetes Maternal Grandmother   . High blood pressure Maternal Grandmother   . High Cholesterol Maternal Grandmother   . Stroke Paternal Grandmother   . High blood pressure Paternal Grandmother   . Arthritis Paternal Grandmother   . Healthy Son   . Early death Son   . Lung cancer Maternal Grandfather   . Arthritis Maternal Grandfather   . Kidney disease Maternal Grandfather   . Healthy Son   . Healthy Son     Review of Systems: Constitutional: negative for fever or malaise Ophthalmic: negative for photophobia, double vision or loss of vision Cardiovascular: negative for chest pain, dyspnea on exertion, or new LE swelling Respiratory: negative for SOB or persistent cough Gastrointestinal: negative for abdominal pain, change in bowel habits or melena Genitourinary: negative for dysuria or gross hematuria, no abnormal uterine bleeding or disharge Musculoskeletal: negative for new gait disturbance or muscular  weakness Integumentary: negative for new or persistent rashes, no breast lumps Neurological: negative for TIA or stroke symptoms Psychiatric: negative for SI or delusions Allergic/Immunologic: negative for hives  Patient Care Team    Relationship Specialty Notifications Start End  Willow Ora, MD PCP - General Family Medicine  06/24/18   Marcelle Overlie, MD Consulting Physician Obstetrics and Gynecology  06/24/18   Elmon Else, MD Consulting Physician Dermatology  06/24/18   Optical, Red I    06/24/18   Marchia Bond, DMD  Dentistry  06/24/18     Objective  Vitals: BP 110/78   Pulse 89   Temp 98.1 F (36.7 C) (Temporal)   Wt 95 lb 9.6 oz (43.4 kg)   SpO2 99%   BMI 16.93 kg/m  General:  Well developed, well nourished, no acute distress  Psych:  Alert and orientedx3,normal mood and affect HEENT:  Normocephalic, atraumatic, non-icteric sclera, PERRL, supple neck without adenopathy, mass or thyromegaly Cardiovascular:  Normal S1, S2, RRR without gallop, rub or murmur Respiratory:  Good breath sounds bilaterally, CTAB with normal respiratory effort Gastrointestinal: normal bowel sounds, soft, non-tender, no noted masses. No HSM MSK:  no deformities, contusions. Joints are without erythema or swelling.  Skin:  Warm, no rashes or suspicious lesions noted Neurologic:    Mental status is normal. Gross motor and sensory exams are normal. Normal gait. No tremor     Commons side effects, risks, benefits, and alternatives for medications and treatment plan prescribed today were discussed, and the patient expressed understanding of the given instructions. Patient is instructed to call or message via MyChart if he/she has any questions or concerns regarding our treatment plan. No barriers to understanding were identified. We discussed Red Flag symptoms and signs in detail. Patient expressed understanding regarding what to do in case of urgent or emergency type symptoms.   Medication list was  reconciled, printed and provided to the patient in AVS. Patient instructions and summary information was reviewed with the patient as documented in the AVS. This note was prepared with assistance of Dragon voice recognition software. Occasional wrong-word or sound-a-like substitutions may have occurred due to the inherent limitations of voice recognition software  This visit occurred during the SARS-CoV-2 public health emergency.  Safety protocols were in place, including screening questions prior to the visit, additional usage of staff PPE, and extensive cleaning of exam room while observing appropriate contact time as indicated for disinfecting solutions.

## 2020-06-29 NOTE — Patient Instructions (Addendum)
Please return in 18 months for your annual complete physical; please come fasting. (July 2023)  I will release your lab results to you on your MyChart account with further instructions. Please reply with any questions.   If you have any questions or concerns, please don't hesitate to send me a message via MyChart or call the office at [phone]. Thank you for visiting with [place] today! It's our pleasure caring for you.   Calcium Intake Recommendations You can take Caltrate Plus twice a day or get it through your diet or other OTC supplements (Viactiv, OsCal etc)  Calcium is a mineral that affects many functions in the body, including:  Blood clotting.  Blood vessel function.  Nerve impulse conduction.  Hormone secretion.  Muscle contraction.  Bone and teeth functions.  Most of your body's calcium supply is stored in your bones and teeth. When your calcium stores are low, you may be at risk for low bone mass, bone loss, and bone fractures. Consuming enough calcium helps to grow healthy bones and teeth and to prevent breakdown over time. It is very important that you get enough calcium if you are:  A child undergoing rapid growth.  An adolescent girl.  A pre- or post-menopausal woman.  A woman whose menstrual cycle has stopped due to anorexia nervosa or regular intense exercise.  An individual with lactose intolerance or a milk allergy.  A vegetarian.  What is my plan? Try to consume the recommended amount of calcium daily based on your age. Depending on your overall health, your health care provider may recommend increased calcium intake.General daily calcium intake recommendations by age are:  Birth to 6 months: 200 mg.  Infants 7 to 12 months: 260 mg.  Children 1 to 3 years: 700 mg.  Children 4 to 8 years: 1,000 mg.  Children 9 to 13 years: 1,300 mg.  Teens 14 to 18 years: 1,300 mg.  Adults 19 to 50 years: 1,000 mg.  Adult women 51 to 70 years: 1,200  mg.  Adult men 51 to 70 years: 1,000 mg.  Adults 71 years and older: 1,200 mg.  Pregnant and breastfeeding teens: 1,300 mg.  Pregnant and breastfeeding adults: 1,000 mg.  What do I need to know about calcium intake?  In order for the body to absorb calcium, it needs vitamin D. You can get vitamin D through (we recommend getting [phone] units of Vitamin D daily) ? Direct exposure of the skin to sunlight. ? Foods, such as egg yolks, liver, saltwater fish, and fortified milk. ? Supplements.  Consuming too much calcium may cause: ? Constipation. ? Decreased absorption of iron and zinc. ? Kidney stones.  Calcium supplements may interact with certain medicines. Check with your health care provider before starting any calcium supplements.  Try to get most of your calcium from food. What foods can I eat? Grains  Fortified oatmeal. Fortified ready-to-eat cereals. Fortified frozen waffles. Vegetables Turnip greens. Broccoli. Fruits Fortified orange juice. Meats and Other Protein Sources Canned sardines with bones. Canned salmon with bones. Soy beans. Tofu. Baked beans. Almonds. [place] nuts. Sunflower seeds. Dairy Milk. Yogurt. Cheese. Cottage cheese. Beverages Fortified soy milk. Fortified rice milk. Sweets/Desserts Pudding. Ice Cream. Milkshakes. Blackstrap molasses. The items listed above may not be a complete list of recommended foods or beverages. Contact your dietitian for more options. What foods can affect my calcium intake? It may be more difficult for your body to use calcium or calcium may leave your body more quickly if you  consume large amounts of:  Sodium.  Protein.  Caffeine.  Alcohol.  This information is not intended to replace advice given to you by your health care provider. Make sure you discuss any questions you have with your health care provider. Document Released: 01/09/2004 Document Revised: 12/15/2015 Document Reviewed: 11/02/2013 Elsevier  Interactive Patient Education  2018 [employer].

## 2020-06-30 NOTE — Progress Notes (Signed)
Please add on total and fractionated bilirubing (direct and indirect), dx: hyperbilirubinemia Thanks, Dr. Mardelle Matte

## 2020-07-03 ENCOUNTER — Other Ambulatory Visit: Payer: Self-pay

## 2020-07-05 ENCOUNTER — Other Ambulatory Visit: Payer: 59

## 2020-07-05 NOTE — Addendum Note (Signed)
Addended by: Cleda Mccreedy F on: 07/05/2020 09:34 AM   Modules accepted: Orders

## 2020-07-06 ENCOUNTER — Encounter: Payer: Self-pay | Admitting: Family Medicine

## 2020-07-06 DIAGNOSIS — R17 Unspecified jaundice: Secondary | ICD-10-CM | POA: Insufficient documentation

## 2020-07-06 LAB — BILIRUBIN, FRACTIONATED(TOT/DIR/INDIR)
Bilirubin, Direct: 0.3 mg/dL — ABNORMAL HIGH (ref 0.0–0.2)
Indirect Bilirubin: 1.3 mg/dL (calc) — ABNORMAL HIGH (ref 0.2–1.2)
Total Bilirubin: 1.6 mg/dL — ABNORMAL HIGH (ref 0.2–1.2)

## 2020-07-06 NOTE — Telephone Encounter (Signed)
Please call and schedule a lab visit for 6 months from now.  Order total and fractionated bilirubin and hepatic panel. Dx: elevated bilirubin.   Thanks.

## 2020-07-10 ENCOUNTER — Other Ambulatory Visit: Payer: Self-pay

## 2020-07-10 DIAGNOSIS — R17 Unspecified jaundice: Secondary | ICD-10-CM

## 2020-08-11 ENCOUNTER — Encounter: Payer: Self-pay | Admitting: Family Medicine

## 2020-10-27 ENCOUNTER — Other Ambulatory Visit: Payer: 59

## 2020-11-01 ENCOUNTER — Other Ambulatory Visit: Payer: 59

## 2020-11-14 ENCOUNTER — Other Ambulatory Visit: Payer: Self-pay

## 2020-11-14 ENCOUNTER — Ambulatory Visit
Admission: RE | Admit: 2020-11-14 | Discharge: 2020-11-14 | Disposition: A | Discharge status: Home / Self Care | Payer: 59 | Source: Ambulatory Visit | Attending: Obstetrics and Gynecology | Admitting: Obstetrics and Gynecology

## 2020-11-14 ENCOUNTER — Other Ambulatory Visit: Payer: Self-pay | Admitting: Obstetrics and Gynecology

## 2020-11-14 DIAGNOSIS — N6310 Unspecified lump in the right breast, unspecified quadrant: Secondary | ICD-10-CM

## 2020-11-14 DIAGNOSIS — N644 Mastodynia: Secondary | ICD-10-CM

## 2021-05-21 ENCOUNTER — Ambulatory Visit
Admission: RE | Admit: 2021-05-21 | Discharge: 2021-05-21 | Disposition: A | Discharge status: Home / Self Care | Payer: 59 | Source: Ambulatory Visit | Attending: Obstetrics and Gynecology | Admitting: Obstetrics and Gynecology

## 2021-05-21 DIAGNOSIS — N6310 Unspecified lump in the right breast, unspecified quadrant: Secondary | ICD-10-CM

## 2021-11-27 NOTE — Telephone Encounter (Signed)
Patient called re: previous message dated 07/06/20.  Patient has CPE scheduled for 02/18/22. Last CPE was 06/29/20.  Patient would like to know if an Order can be placed for Bilirubin. Patient's contact ph# [phone]. Please advise.

## 2021-12-27 ENCOUNTER — Encounter: Payer: 59 | Admitting: Family Medicine

## 2022-01-15 IMAGING — MG DIGITAL DIAGNOSTIC BILAT W/ TOMO W/ CAD
6 of 10 series · 6 of 30 positions shown · non-contrast
Comparison: None.

CLINICAL DATA: 38-year-old female with diffuse, intermittent right
breast pain with her periods for the last 3-4 months. The patient
also notes a palpable lump behind the right nipple. The patient was
recently breast feeding, and ceased earlier this year.

EXAM:
DIGITAL DIAGNOSTIC BILATERAL MAMMOGRAM WITH CAD AND TOMO
ULTRASOUND RIGHT BREAST

[L CC synth-2D]
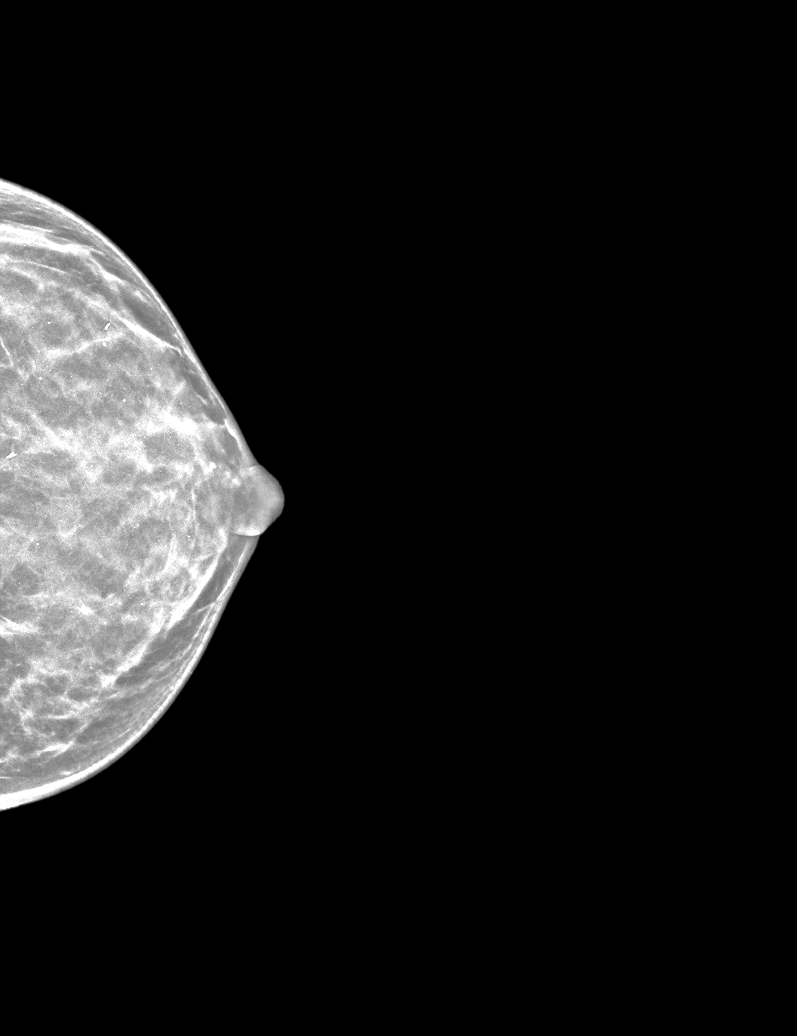

[R TAN synth-2D]
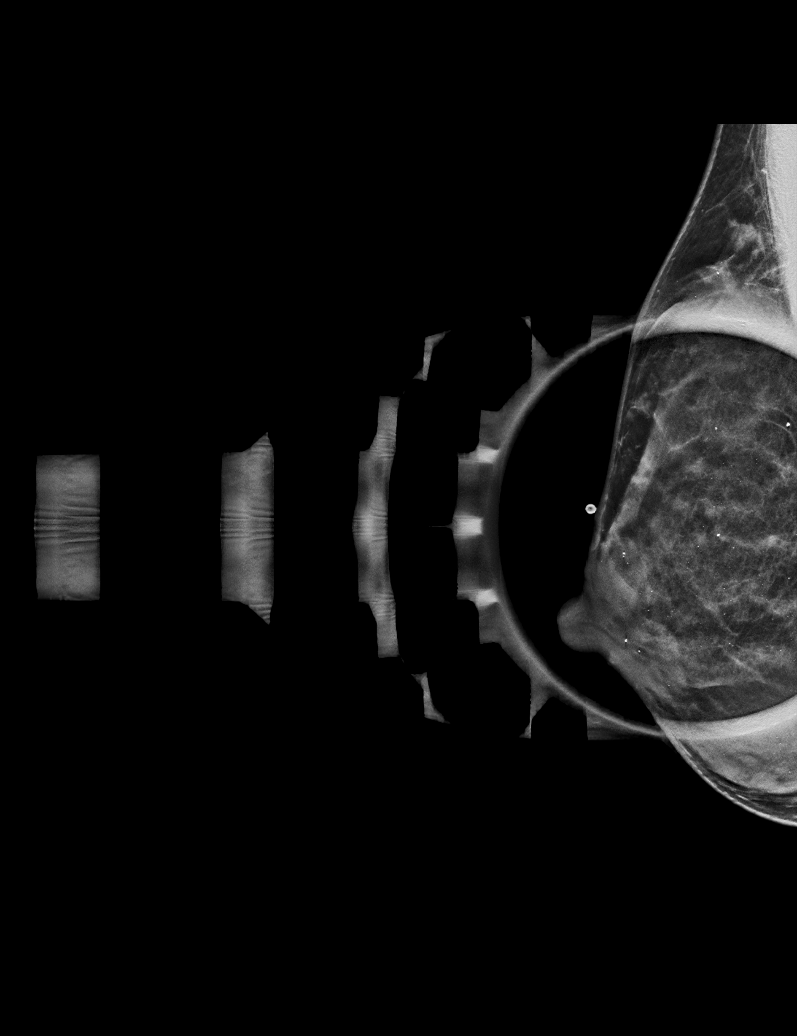

[R MLO synth-2D]
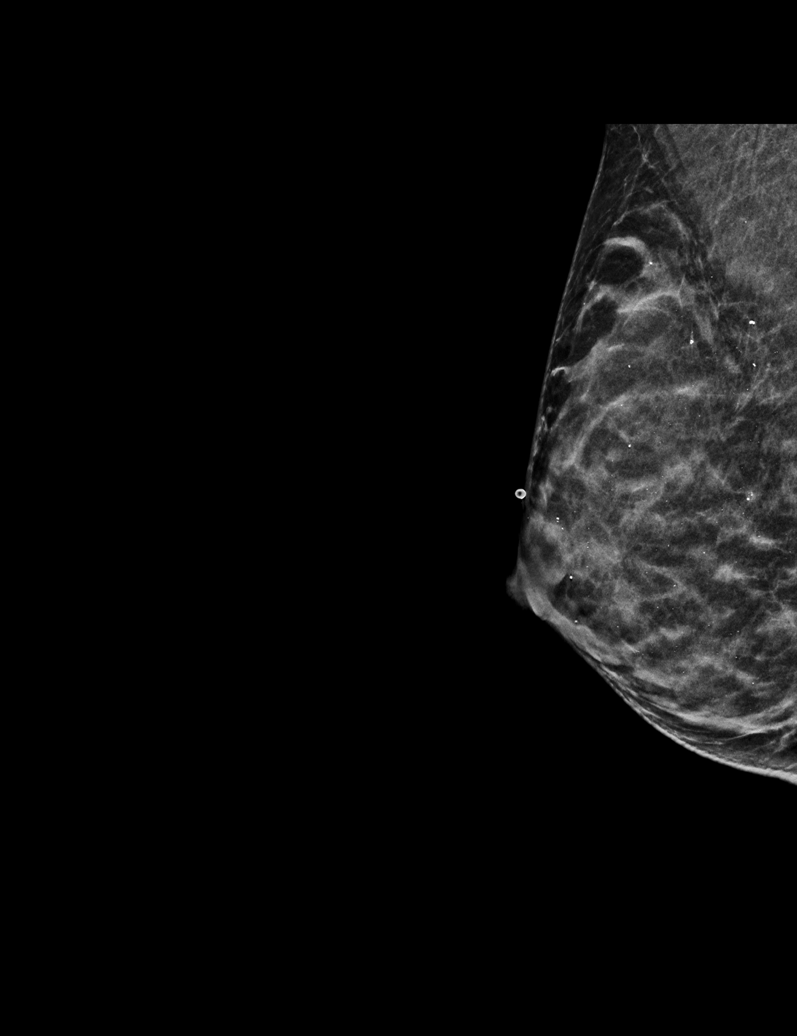

[R CC synth-2D]
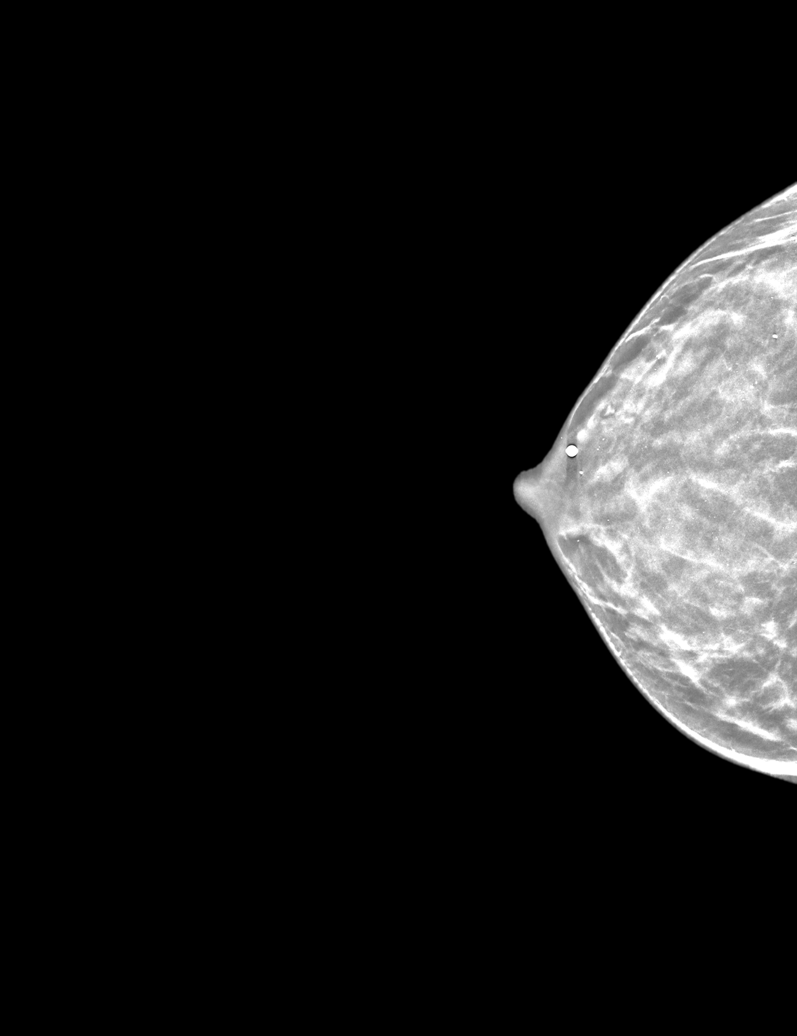

[L MLO synth-2D]
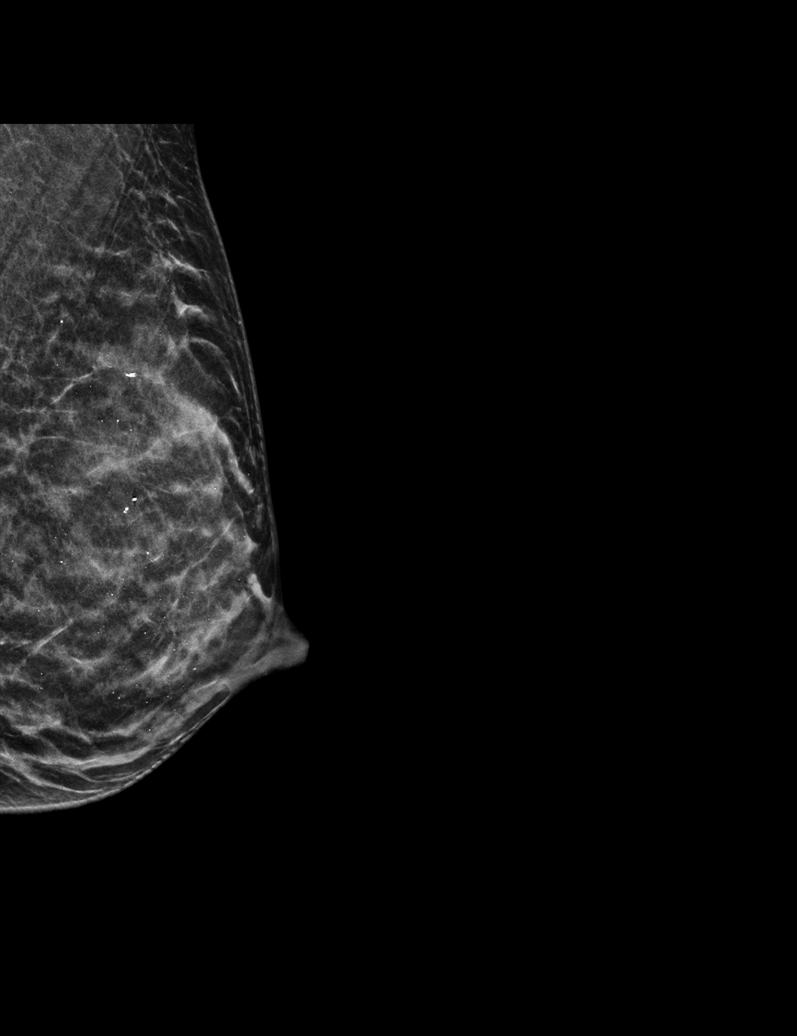

[L MLO tomo · tomo slice 18/35.0]
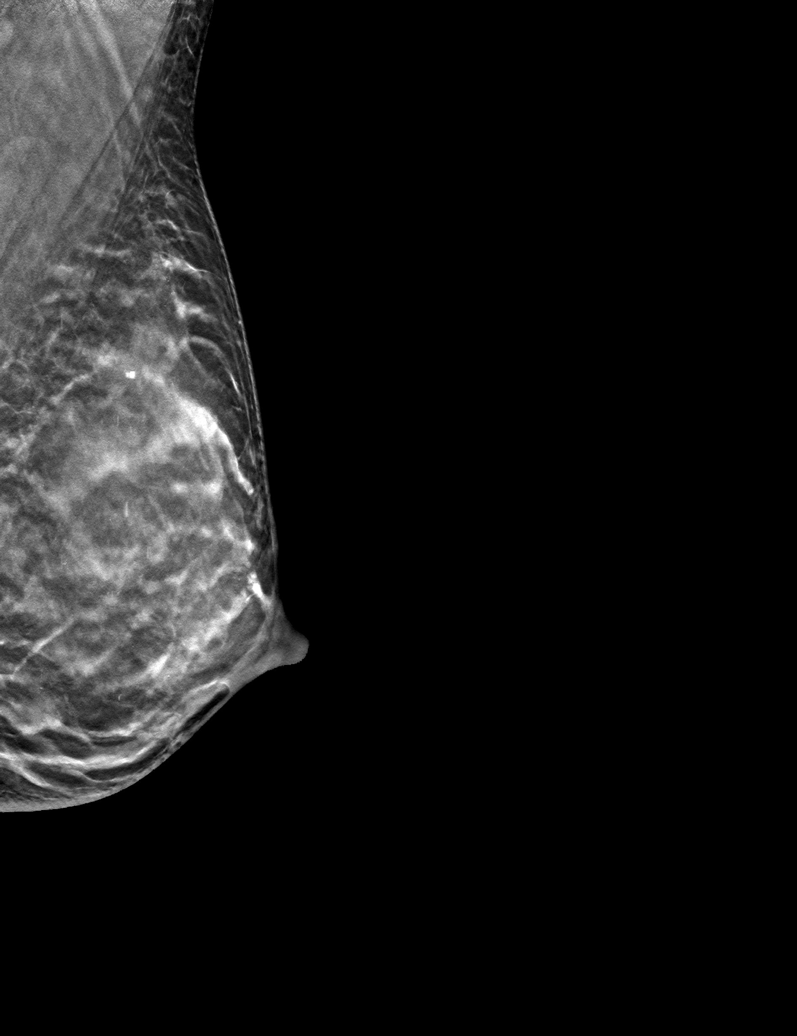

[6 of 30 positions shown; findings below may reference images not displayed]

ACR Breast Density Category d: The breast tissue is extremely dense,
which lowers the sensitivity of mammography.
FINDINGS: No suspicious masses, distortions or calcifications are seen within
either breast. Diffuse punctate and round calcifications scattered
bilaterally are consistent with a benign process. A radiopaque BB
was placed in the subareolar right breast at the site of the
patient's palpable lump. No focal findings are identified.

Mammographic images were processed with CAD.

Targeted ultrasound is performed, showing an oval, circumscribed
multi-cystic mass in the superficial retroareolar right breast at
the 12 o'clock position. It measures 1.3 x 1.1 x 0.5 cm. There is
peripheral but no internal vascularity. This may correspond with the
patient's palpable lump.
IMPRESSION: 1. Probably benign, probable right breast cluster of cysts at the 12
o'clock retroareolar position. Recommendation is for short-term
imaging follow-up.
2. No mammographic evidence of malignancy in the remainder of either
breast.

RECOMMENDATION:
Right breast ultrasound in 6 months.

I have discussed the findings and recommendations with the patient.
If applicable, a reminder letter will be sent to the patient
regarding the next appointment.

BI-RADS CATEGORY  3: Probably benign.

## 2022-02-18 ENCOUNTER — Ambulatory Visit (INDEPENDENT_AMBULATORY_CARE_PROVIDER_SITE_OTHER): Payer: 59 | Admitting: Family Medicine

## 2022-02-18 ENCOUNTER — Encounter: Payer: Self-pay | Admitting: Family Medicine

## 2022-02-18 VITALS — BP 110/64 | HR 75 | Temp 98.5°F | Ht 63.0 in | Wt 98.6 lb

## 2022-02-18 DIAGNOSIS — Z0000 Encounter for general adult medical examination without abnormal findings: Secondary | ICD-10-CM | POA: Diagnosis not present

## 2022-02-18 DIAGNOSIS — Z23 Encounter for immunization: Secondary | ICD-10-CM | POA: Diagnosis not present

## 2022-02-18 LAB — COMPREHENSIVE METABOLIC PANEL
ALT: 12 U/L (ref 0–35)
AST: 16 U/L (ref 0–37)
Albumin: 4.6 g/dL (ref 3.5–5.2)
Alkaline Phosphatase: 34 U/L — ABNORMAL LOW (ref 39–117)
BUN: 11 mg/dL (ref 6–23)
CO2: 27 mEq/L (ref 19–32)
Calcium: 9.7 mg/dL (ref 8.4–10.5)
Chloride: 103 mEq/L (ref 96–112)
Creatinine, Ser: 0.74 mg/dL (ref 0.40–1.20)
GFR: 102.42 mL/min (ref >60.00)
Glucose, Bld: 135 mg/dL — ABNORMAL HIGH (ref 70–99)
Potassium: 3.8 mEq/L (ref 3.5–5.1)
Sodium: 139 mEq/L (ref 135–145)
Total Bilirubin: 1.6 mg/dL — ABNORMAL HIGH (ref 0.2–1.2)
Total Protein: 7.2 g/dL (ref 6.0–8.3)

## 2022-02-18 LAB — CBC WITH DIFFERENTIAL/PLATELET
Basophils Absolute: 0 10*3/uL (ref 0.0–0.1)
Basophils Relative: 0.6 % (ref 0.0–3.0)
Eosinophils Absolute: 0.1 10*3/uL (ref 0.0–0.7)
Eosinophils Relative: 0.9 % (ref 0.0–5.0)
HCT: 38.4 % (ref 36.0–46.0)
Hemoglobin: 13 g/dL (ref 12.0–15.0)
Lymphocytes Relative: 20.5 % (ref 12.0–46.0)
Lymphs Abs: 1.3 10*3/uL (ref 0.7–4.0)
MCHC: 33.9 g/dL (ref 30.0–36.0)
MCV: 90.8 fl (ref 78.0–100.0)
Monocytes Absolute: 0.4 10*3/uL (ref 0.1–1.0)
Monocytes Relative: 5.8 % (ref 3.0–12.0)
Neutro Abs: 4.5 10*3/uL (ref 1.4–7.7)
Neutrophils Relative %: 72.2 % (ref 43.0–77.0)
Platelets: 187 10*3/uL (ref 150.0–400.0)
RBC: 4.23 Mil/uL (ref 3.87–5.11)
RDW: 13.8 % (ref 11.5–15.5)
WBC: 6.2 10*3/uL (ref 4.0–10.5)

## 2022-02-18 LAB — LIPID PANEL
Cholesterol: 161 mg/dL (ref 0–200)
HDL: 73.6 mg/dL (ref >39.00)
LDL Cholesterol: 71 mg/dL (ref 0–99)
NonHDL: 87.55
Total CHOL/HDL Ratio: 2
Triglycerides: 85 mg/dL (ref 0.0–149.0)
VLDL: 17 mg/dL (ref 0.0–40.0)

## 2022-02-18 NOTE — Progress Notes (Signed)
Subjective  Chief Complaint  Patient presents with   Annual Exam    Pyt here for Annual exam and is currently fasting    HPI: Desiree Garza is a 41 y.o. female who presents to [facility] today for a Female Wellness Visit.   Wellness Visit: annual visit with health maintenance review and exam without Pap  HM: sees gyn for female wellness. All screens are current. Happy and healthy. 3 children. 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 No jaundice or pain.   Assessment  1. Annual physical exam   2. Need for influenza vaccination   3. [place]one syndrome      Plan  Female Wellness Visit: Age appropriate Health Maintenance and Prevention measures were discussed with patient. Included topics are cancer screening recommendations, ways to keep healthy (see AVS) including dietary and exercise recommendations, regular eye and dental care, use of seat belts, and avoidance of moderate alcohol use and tobacco use.  BMI: discussed patient's BMI and encouraged positive lifestyle modifications to help get to or maintain a target BMI. HM needs and immunizations were addressed and ordered. See below for orders. See HM and immunization section for updates. Routine labs and screening tests ordered including cmp, cbc and lipids where appropriate. Discussed recommendations regarding Vit D and calcium supplementation (see AVS) Monitor lfts. Education given.   Follow up: Return in about 1 year (around 02/19/2023) for complete physical.   Orders Placed This Encounter  Procedures   Flu Vaccine QUAD 6+ mos PF IM (Fluarix Quad PF)   CBC with Differential/Platelet   Comprehensive metabolic panel   Lipid panel   Bilirubin, fractionated(tot/dir/indir)   No orders of the defined types were placed in this encounter.      Body mass index is 17.47 kg/m. Wt Readings from Last 3 Encounters:  02/18/22 98 lb 9.6 oz (44.7 kg)  06/29/20 95 lb 9.6 oz (43.4 kg)  06/29/19  95 lb 6.4 oz (43.3 kg)   Need for contraception: No, vasectomy  Patient Active Problem List   Diagnosis Date Noted   Elevated bilirubin 07/06/2020    Mildly elevated direct and indirect on screening labs 06/2020; asymptomatic.     Health Maintenance  Topic Date Due   COVID-19 Vaccine (3 - Pfizer series) 03/06/2022 (Originally 12/13/2019)   PAP SMEAR-Modifier  06/27/2025   TETANUS/TDAP  02/24/2028   INFLUENZA VACCINE  Completed   Hepatitis C Screening  Completed   HIV Screening  Completed   HPV VACCINES  Discontinued   Immunization History  Administered Date(s) Administered   HPV Quadrivalent 06/11/2003   Influenza Inj Mdck Quad Pf 03/21/2018   Influenza Split 05/09/2010, 04/09/2020   Influenza,inj,Quad PF,6+ Mos 05/09/2010, 03/21/2018, 04/09/2019, 02/18/2022   PFIZER(Purple Top)SARS-COV-2 Vaccination 09/24/2019, 10/18/2019   PPD Test 01/19/2019   Rho (D) Immune Globulin 02/03/2013   Td 02/06/2006   We updated and reviewed the patient's past history in detail and it is documented below. Allergies: Patient is allergic to latex. Past Medical History Patient  has a past medical history of Anemia, Cervical dysplasia, History of cryosurgery, PONV (postoperative nausea and vomiting), PPH (postpartum hemorrhage) (11/04/2017 and 01/2013), and Vacuum extractor delivery, delivered (07/07/2015). Past Surgical History Patient  has a past surgical history that includes Gynecologic cryosurgery (2012); Wisdom tooth extraction (2006); Dilation and evacuation (N/A, 09/24/2016); urethrea stretching (1991); Dilation and evacuation (N/A, 11/05/2016); and Dilation and evacuation (N/A, 11/04/2017). Family History: Patient family history includes Arthritis in her maternal grandfather, maternal grandmother,  and paternal grandmother; Colon cancer in her maternal grandmother; Depression in her maternal grandmother; Diabetes in her maternal grandmother; Early death in her son; Healthy in her son, son, and son;  High Cholesterol in her father, maternal grandmother, and mother; High blood pressure in her maternal grandmother and paternal grandmother; Hypertension in her father and mother; Kidney disease in her maternal grandfather; Lung cancer in her maternal grandfather; Miscarriages / [place] in her mother; Stroke in her paternal grandmother; Testicular cancer in her father; Thyroid disease in her maternal grandmother. Social History:  Patient  reports that she has never smoked. She has never used smokeless tobacco. She reports current alcohol use of about 1.0 standard drink of alcohol per week. She reports that she does not use drugs.  Review of Systems: Constitutional: negative for fever or malaise Ophthalmic: negative for photophobia, double vision or loss of vision Cardiovascular: negative for chest pain, dyspnea on exertion, or new LE swelling Respiratory: negative for SOB or persistent cough Gastrointestinal: negative for abdominal pain, change in bowel habits or melena Genitourinary: negative for dysuria or gross hematuria, no abnormal uterine bleeding or disharge Musculoskeletal: negative for new gait disturbance or muscular weakness Integumentary: negative for new or persistent rashes, no breast lumps Neurological: negative for TIA or stroke symptoms Psychiatric: negative for SI or delusions Allergic/Immunologic: negative for hives Patient Care Team    Relationship Specialty Notifications Start End  Willow Ora, MD PCP - General Family Medicine  06/24/18   Marcelle Overlie, MD Consulting Physician Obstetrics and Gynecology  06/24/18   Elmon Else, MD Consulting Physician Dermatology  06/24/18   Optical, Red I    06/24/18   Marchia Bond, DMD  Dentistry  06/24/18     Objective  Vitals: BP 110/64   Pulse 75   Temp 98.5 F (36.9 C)   Ht 5\' 3"  (1.6 m)   Wt 98 lb 9.6 oz (44.7 kg)   SpO2 98%   BMI 17.47 kg/m  General:  Well developed, well nourished, no acute distress  Psych:   Alert and orientedx3,normal mood and affect HEENT:  Normocephalic, atraumatic, non-icteric sclera, supple neck without adenopathy, mass or thyromegaly Cardiovascular:  Normal S1, S2, RRR without gallop, rub or murmur Respiratory:  Good breath sounds bilaterally, CTAB with normal respiratory effort Gastrointestinal: normal bowel sounds, soft, non-tender, no noted masses. No HSM MSK: no deformities, contusions. Joints are without erythema or swelling. Spine and CVA region are nontender Skin:  Warm, no rashes or suspicious lesions noted   Commons side effects, risks, benefits, and alternatives for medications and treatment plan prescribed today were discussed, and the patient expressed understanding of the given instructions. Patient is instructed to call or message via MyChart if he/she has any questions or concerns regarding our treatment plan. No barriers to understanding were identified. We discussed Red Flag symptoms and signs in detail. Patient expressed understanding regarding what to do in case of urgent or emergency type symptoms.  Medication list was reconciled, printed and provided to the patient in AVS. Patient instructions and summary information was reviewed with the patient as documented in the AVS. This note was prepared with assistance of Dragon voice recognition software. Occasional wrong-word or sound-a-like substitutions may have occurred due to the inherent limitations of voice recognition software  This visit occurred during the SARS-CoV-2 public health emergency.  Safety protocols were in place, including screening questions prior to the visit, additional usage of staff PPE, and extensive cleaning of exam room while observing appropriate contact time  as indicated for disinfecting solutions.

## 2022-02-18 NOTE — Patient Instructions (Addendum)
Please return in 12 months for your annual complete physical; please come fasting.   I will release your lab results to you on your MyChart account with further instructions. You may see the results before I do, but when I review them I will send you a message with my report or have my assistant call you if things need to be discussed. Please reply to my message with any questions. Thank you!   If you have any questions or concerns, please don't hesitate to send me a message via MyChart or call the office at [phone]. Thank you for visiting with [place] today! It's our pleasure caring for you.   Bilirubin Test Why am I having this test? The bilirubin test is used to evaluate liver function. A health care provider may recommend this test: For a newborn who has jaundice. For an adult who has jaundice. If you have hemolytic anemia. What is being tested? This test measures the level of bilirubin in the body. Bilirubin is produced when red blood cells are broken down. Normally, bilirubin is broken down in the liver and eliminated from the blood (excreted) as a component of bile. However, when red blood cells are broken down more quickly than usual, or when there is a problem in how bile is excreted, bilirubin levels can become raised (elevated). In newborns with jaundice, elevated bilirubin levels may put the child at risk for brain damage. What kind of sample is taken? This test can be performed using one of the following methods: Blood sample. This is usually collected by inserting a needle into a blood vessel. Urine sample. This is collected using a germ-free (sterile) container that is given to you by the lab. How do I prepare for this test? Fasting requirements for this test may vary among different labs. You may be asked not to eat or drink anything except water after midnight on the night before the test. Follow instructions from your health care provider about eating or drinking  restrictions. How are the results reported? Your test results will be reported as values. Your health care provider will compare your results to normal ranges that were established after testing a large group of people (reference ranges). Reference ranges may vary among labs and hospitals. For this test, common reference ranges are: Blood samples Newborn total bilirubin: 1-12 mg/dL or 44.0-102 micromoles/L (SI units). Child, adult, and adult aged 42 or older: Total bilirubin: 0.3-1 mg/dL or 7.2-53 micromoles/L (SI units). Indirect bilirubin: 0.2-0.8 mg/dL or 6.6-44 micromoles/L (SI units). Direct bilirubin: 0.1-0.3 mg/dL or 0.3-4.7 micromoles/L (SI units). Urine samples 0-0.02 mg/dL or 4-2.59 micromoles/L (SI units). What do the results mean? Results that are greater than the reference ranges may indicate: Gallstones or obstruction of the bile ducts. Certain tumors of the liver. Disorders that affect the breakdown and excretion of bilirubin. Disorders that cause the destruction of red blood cells. Liver diseases. Reaction to certain medicines. Reaction to blood transfusion. Talk with your health care provider about what your results mean. Questions to ask your health care provider Ask your health care provider, or the department that is doing the test: When will my results be ready? How will I get my results? What are my treatment options? What other tests do I need? What are my next steps? Summary The bilirubin test is used to evaluate liver function. Bilirubin is produced when red blood cells are broken down. When red blood cells are broken down more quickly than usual, or when there is a  problem with how bile is excreted, bilirubin levels can become elevated. Results that are greater than the reference ranges may indicate a number of diseases. This information is not intended to replace advice given to you by your health care provider. Make sure you discuss any questions you have  with your health care provider. Document Revised: 09/07/2020 Document Reviewed: 09/07/2020 Elsevier Patient Education  2023 [employer].

## 2022-02-19 LAB — BILIRUBIN, FRACTIONATED(TOT/DIR/INDIR)
Bilirubin, Direct: 0.3 mg/dL — ABNORMAL HIGH (ref 0.0–0.2)
Indirect Bilirubin: 1.3 mg/dL (calc) — ABNORMAL HIGH (ref 0.2–1.2)
Total Bilirubin: 1.6 mg/dL — ABNORMAL HIGH (ref 0.2–1.2)

## 2022-02-19 NOTE — Progress Notes (Signed)
Please call patient: I have reviewed his/her lab results. Please clarify again, was she fasting? Her glucose is 135 which is very high IF fasting. If she had eaten, then it is ok.  IF she was fasting, need to add on an A1c Her bilirubin is stable and the same as last year. Consistent with benign gilberts syndrome. Nothing further needed there. Cholesterol looks grest.

## 2022-04-16 ENCOUNTER — Other Ambulatory Visit: Payer: Self-pay | Admitting: Obstetrics and Gynecology

## 2022-04-16 DIAGNOSIS — N6310 Unspecified lump in the right breast, unspecified quadrant: Secondary | ICD-10-CM

## 2022-06-19 ENCOUNTER — Ambulatory Visit
Admission: RE | Admit: 2022-06-19 | Discharge: 2022-06-19 | Disposition: A | Discharge status: Home / Self Care | Payer: 59 | Source: Ambulatory Visit | Attending: Obstetrics and Gynecology | Admitting: Obstetrics and Gynecology

## 2022-06-19 ENCOUNTER — Other Ambulatory Visit: Payer: 59

## 2022-06-19 DIAGNOSIS — N6310 Unspecified lump in the right breast, unspecified quadrant: Secondary | ICD-10-CM

## 2022-12-19 LAB — HM PAP SMEAR: HM Pap smear: NEGATIVE

## 2023-01-23 ENCOUNTER — Encounter (INDEPENDENT_AMBULATORY_CARE_PROVIDER_SITE_OTHER): Payer: Self-pay

## 2023-02-21 ENCOUNTER — Ambulatory Visit (INDEPENDENT_AMBULATORY_CARE_PROVIDER_SITE_OTHER): Payer: Managed Care, Other (non HMO) | Admitting: Family Medicine

## 2023-02-21 ENCOUNTER — Encounter: Payer: Self-pay | Admitting: Family Medicine

## 2023-02-21 VITALS — BP 110/62 | HR 71 | Temp 98.5°F | Ht 63.0 in | Wt 97.8 lb

## 2023-02-21 DIAGNOSIS — Z0001 Encounter for general adult medical examination with abnormal findings: Secondary | ICD-10-CM | POA: Diagnosis not present

## 2023-02-21 DIAGNOSIS — Z23 Encounter for immunization: Secondary | ICD-10-CM

## 2023-02-21 DIAGNOSIS — M25551 Pain in right hip: Secondary | ICD-10-CM

## 2023-02-21 LAB — CBC WITH DIFFERENTIAL/PLATELET
Basophils Absolute: 0 10*3/uL (ref 0.0–0.1)
Basophils Relative: 0.4 % (ref 0.0–3.0)
Eosinophils Absolute: 0 10*3/uL (ref 0.0–0.7)
Eosinophils Relative: 1.2 % (ref 0.0–5.0)
HCT: 37.3 % (ref 36.0–46.0)
Hemoglobin: 12.4 g/dL (ref 12.0–15.0)
Lymphocytes Relative: 37.1 % (ref 12.0–46.0)
Lymphs Abs: 1.5 10*3/uL (ref 0.7–4.0)
MCHC: 33.4 g/dL (ref 30.0–36.0)
MCV: 90.3 fl (ref 78.0–100.0)
Monocytes Absolute: 0.4 10*3/uL (ref 0.1–1.0)
Monocytes Relative: 9 % (ref 3.0–12.0)
Neutro Abs: 2.2 10*3/uL (ref 1.4–7.7)
Neutrophils Relative %: 52.3 % (ref 43.0–77.0)
Platelets: 230 10*3/uL (ref 150.0–400.0)
RBC: 4.13 Mil/uL (ref 3.87–5.11)
RDW: 12.6 % (ref 11.5–15.5)
WBC: 4.1 10*3/uL (ref 4.0–10.5)

## 2023-02-21 LAB — COMPREHENSIVE METABOLIC PANEL
ALT: 14 U/L (ref 0–35)
AST: 16 U/L (ref 0–37)
Albumin: 4.3 g/dL (ref 3.5–5.2)
Alkaline Phosphatase: 33 U/L — ABNORMAL LOW (ref 39–117)
BUN: 12 mg/dL (ref 6–23)
CO2: 27 meq/L (ref 19–32)
Calcium: 8.7 mg/dL (ref 8.4–10.5)
Chloride: 104 meq/L (ref 96–112)
Creatinine, Ser: 0.66 mg/dL (ref 0.40–1.20)
GFR: 110.27 mL/min (ref >60.00)
Glucose, Bld: 77 mg/dL (ref 70–99)
Potassium: 3.9 meq/L (ref 3.5–5.1)
Sodium: 136 meq/L (ref 135–145)
Total Bilirubin: 1.2 mg/dL (ref 0.2–1.2)
Total Protein: 6.8 g/dL (ref 6.0–8.3)

## 2023-02-21 LAB — LIPID PANEL
Cholesterol: 151 mg/dL (ref 0–200)
HDL: 62.4 mg/dL (ref >39.00)
LDL Cholesterol: 72 mg/dL (ref 0–99)
NonHDL: 88.37
Total CHOL/HDL Ratio: 2
Triglycerides: 80 mg/dL (ref 0.0–149.0)
VLDL: 16 mg/dL (ref 0.0–40.0)

## 2023-02-21 NOTE — Progress Notes (Signed)
Subjective  Chief Complaint  Patient presents with   Annual Exam    Pt fasting for labs     HPI: Desiree Garza is a 42 y.o. female who presents to [facility] [place] today for a Female Wellness Visit.   Wellness Visit: annual visit with health maintenance review and exam without Pap  Health maintenance: Sees GYN for female wellness.  All screens are current.  Stay-at-home mom, 3 boys.  Healthy lifestyle.  Eligible for flu shot today. Only concern is about 68-month history of right hip pain.  Describes acute onset of right hip pain while standing.  Pain with weightbearing only.  Lasted several weeks at first, treated with Advil.  Had care from [facility] and then physical therapy.  Pain is much improved.  Only intermittent at times.  No clear pattern.  No GI or GU symptoms.  No knee pain.  No injury or trauma.  Exercises regularly.  Evaluated by GYN also.  Unclear etiology  Assessment  1. Encounter for well adult exam with abnormal findings   2. Gilbert's syndrome   3. Right hip pain   4. Need for influenza vaccination      Plan  Female Wellness Visit: Age appropriate Health Maintenance and Prevention measures were discussed with patient. Included topics are cancer screening recommendations, ways to keep healthy (see AVS) including dietary and exercise recommendations, regular eye and dental care, use of seat belts, and avoidance of moderate alcohol use and tobacco use.  BMI: discussed patient's BMI and encouraged positive lifestyle modifications to help get to or maintain a target BMI. HM needs and immunizations were addressed and ordered. See below for orders. See HM and immunization section for updates. Routine labs and screening tests ordered including cmp, cbc and lipids where appropriate. Discussed recommendations regarding Vit D and calcium supplementation (see AVS) Right hip pain: Discussed possible differential.  Labral tear, small discussed.  Intermittent  tendinitis.  Patient will monitor, recommend follow-up with orthopedics if not improving or resolving.  May warrant MRI.  Patient understands and agrees.  Exam is benign today  Follow up: 12 months for complete physical  Orders Placed This Encounter  Procedures   Flu vaccine trivalent PF, 6mos and older(Flulaval,Afluria,Fluarix,Fluzone)   Lipid panel   Comprehensive metabolic panel   CBC with Differential/Platelet   No orders of the defined types were placed in this encounter.      Body mass index is 17.32 kg/m. Wt Readings from Last 3 Encounters:  02/21/23 97 lb 12.8 oz (44.4 kg)  02/18/22 98 lb 9.6 oz (44.7 kg)  06/29/20 95 lb 9.6 oz (43.4 kg)   Need for contraception: No,  husband had vasectomy  Patient Active Problem List   Diagnosis Date Noted Date Diagnosed   Gilbert's syndrome 07/06/2020     Fractionated bili 2023; minimally elevated direct and indirect    Health Maintenance  Topic Date Due   COVID-19 Vaccine (3 - 2023-24 season) 03/09/2023 (Originally 02/09/2023)   PAP SMEAR-Modifier  06/27/2025   DTaP/Tdap/Td (3 - Td or Tdap) 10/23/2027   INFLUENZA VACCINE  Completed   Hepatitis C Screening  Completed   HIV Screening  Completed   HPV VACCINES  Discontinued   Immunization History  Administered Date(s) Administered   HPV Quadrivalent 06/11/2003   Influenza Inj Mdck Quad Pf 03/21/2018   Influenza Split 05/09/2010, 04/09/2020   Influenza, Seasonal, Injecte, Preservative Fre 02/21/2023   Influenza,inj,Quad PF,6+ Mos 05/09/2010, 03/21/2018, 04/09/2019, 02/18/2022   PFIZER(Purple Top)SARS-COV-2  Vaccination 09/24/2019, 10/18/2019   PPD Test 01/19/2019   Rho (D) Immune Globulin 02/03/2013   Td 02/06/2006   Tdap 10/22/2017   We updated and reviewed the patient's past history in detail and it is documented below. Allergies: Patient is allergic to latex. Past Medical History Patient  has a past medical history of Anemia, Cervical dysplasia, History of cryosurgery,  PONV (postoperative nausea and vomiting), PPH (postpartum hemorrhage) (11/04/2017 and 01/2013), and Vacuum extractor delivery, delivered (07/07/2015). Past Surgical History Patient  has a past surgical history that includes Gynecologic cryosurgery (2012); Wisdom tooth extraction (2006); Dilation and evacuation (N/A, 09/24/2016); urethrea stretching (1991); Dilation and evacuation (N/A, 11/05/2016); and Dilation and evacuation (N/A, 11/04/2017). Family History: Patient family history includes Arthritis in her maternal grandfather, maternal grandmother, and paternal grandmother; Colon cancer in her maternal grandmother; Depression in her maternal grandmother; Diabetes in her maternal grandmother; Early death in her son; Healthy in her son, son, and son; High Cholesterol in her father, maternal grandmother, and mother; High blood pressure in her maternal grandmother and paternal grandmother; Hypertension in her father and mother; Kidney disease in her maternal grandfather; Lung cancer in her maternal grandfather; Miscarriages / [place] in her mother; Stroke in her paternal grandmother; Testicular cancer in her father; Thyroid disease in her maternal grandmother. Social History:  Patient  reports that she has never smoked. She has never used smokeless tobacco. She reports current alcohol use of about 1.0 standard drink of alcohol per week. She reports that she does not use drugs.  Review of Systems: Constitutional: negative for fever or malaise Ophthalmic: negative for photophobia, double vision or loss of vision Cardiovascular: negative for chest pain, dyspnea on exertion, or new LE swelling Respiratory: negative for SOB or persistent cough Gastrointestinal: negative for abdominal pain, change in bowel habits or melena Genitourinary: negative for dysuria or gross hematuria, no abnormal uterine bleeding or disharge Musculoskeletal: negative for new gait disturbance or muscular weakness Integumentary:  negative for new or persistent rashes, no breast lumps Neurological: negative for TIA or stroke symptoms Psychiatric: negative for SI or delusions Allergic/Immunologic: negative for hives Patient Care Team    Relationship Specialty Notifications Start End  Willow Ora, MD PCP - General Family Medicine  06/24/18   Marcelle Overlie, MD Consulting Physician Obstetrics and Gynecology  06/24/18   Elmon Else, MD Consulting Physician Dermatology  06/24/18   Optical, Red I    06/24/18   Marchia Bond, DMD  Dentistry  06/24/18     Objective  Vitals: BP 110/62   Pulse 71   Temp 98.5 F (36.9 C) (Temporal)   Ht 5\' 3"  (1.6 m)   Wt 97 lb 12.8 oz (44.4 kg)   SpO2 99%   Breastfeeding No   BMI 17.32 kg/m  General:  Well developed, well nourished, no acute distress  Psych:  Alert and orientedx3,normal mood and affect HEENT:  Normocephalic, atraumatic, non-icteric sclera, supple neck without adenopathy, mass or thyromegaly Cardiovascular:  Normal S1, S2, RRR without gallop, rub or murmur Respiratory:  Good breath sounds bilaterally, CTAB with normal respiratory effort Gastrointestinal: normal bowel sounds, soft, non-tender, no noted masses. No HSM MSK: no deformities, contusions. Joints are without erythema or swelling. Spine and CVA region are nontender Right hip: Full range of motion, nontender, normal gait Skin:  Warm, no rashes or suspicious lesions noted Neurologic:    Mental status is normal. Gross motor and sensory exams are normal. Normal gait. No tremor   Commons side effects, risks, benefits, and  alternatives for medications and treatment plan prescribed today were discussed, and the patient expressed understanding of the given instructions. Patient is instructed to call or message via MyChart if he/she has any questions or concerns regarding our treatment plan. No barriers to understanding were identified. We discussed Red Flag symptoms and signs in detail. Patient expressed  understanding regarding what to do in case of urgent or emergency type symptoms.  Medication list was reconciled, printed and provided to the patient in AVS. Patient instructions and summary information was reviewed with the patient as documented in the AVS. This note was prepared with assistance of Dragon voice recognition software. Occasional wrong-word or sound-a-like substitutions may have occurred due to the inherent limitations of voice recognition software

## 2023-02-21 NOTE — Patient Instructions (Signed)
Please return in 12 months for your annual complete physical; please come fasting.   I will release your lab results to you on your MyChart account with further instructions. You may see the results before I do, but when I review them I will send you a message with my report or have my assistant call you if things need to be discussed. Please reply to my message with any questions. Thank you!   If you have any questions or concerns, please don't hesitate to send me a message via MyChart or call the office at [phone]. Thank you for visiting with [place] today! It's our pleasure caring for you.   Please do these things to maintain good health!  Exercise at least 30-45 minutes a day,  4-5 days a week.  Eat a low-fat diet with lots of fruits and vegetables, up to 7-9 servings per day. Drink plenty of water daily. Try to drink 8 8oz glasses per day. Seatbelts can save your life. Always wear your seatbelt. Place Smoke Detectors on every level of your home and check batteries every year. Schedule an appointment with an eye doctor for an eye exam every 1-2 years Safe sex - use condoms to protect yourself from STDs if you could be exposed to these types of infections. Use birth control if you do not want to become pregnant and are sexually active. Avoid heavy alcohol use. If you drink, keep it to less than 2 drinks/day and not every day. Health Care Power of Attorney.  Choose someone you trust that could speak for you if you became unable to speak for yourself. Depression is common in our stressful world.If you're feeling down or losing interest in things you normally enjoy, please come in for a visit. If anyone is threatening or hurting you, please get help. Physical or Emotional Violence is never OK.

## 2023-02-24 NOTE — Progress Notes (Signed)
Labs reviewed.  The ASCVD Risk score (Arnett DK, et al., 2019) failed to calculate for the following reasons:   The 2019 ASCVD risk score is only valid for ages 51 to 57

## 2023-03-03 ENCOUNTER — Encounter: Payer: Self-pay | Admitting: Family Medicine

## 2023-03-17 ENCOUNTER — Other Ambulatory Visit: Payer: Self-pay | Admitting: Obstetrics and Gynecology

## 2023-03-17 DIAGNOSIS — Z1231 Encounter for screening mammogram for malignant neoplasm of breast: Secondary | ICD-10-CM

## 2023-04-08 ENCOUNTER — Ambulatory Visit
Admission: RE | Admit: 2023-04-08 | Discharge: 2023-04-08 | Disposition: A | Discharge status: Home / Self Care | Payer: Managed Care, Other (non HMO) | Source: Ambulatory Visit | Attending: Obstetrics and Gynecology | Admitting: Obstetrics and Gynecology

## 2023-04-08 DIAGNOSIS — Z1231 Encounter for screening mammogram for malignant neoplasm of breast: Secondary | ICD-10-CM

## 2023-04-11 ENCOUNTER — Other Ambulatory Visit: Payer: Self-pay | Admitting: Obstetrics and Gynecology

## 2023-04-11 DIAGNOSIS — R928 Other abnormal and inconclusive findings on diagnostic imaging of breast: Secondary | ICD-10-CM

## 2023-04-17 ENCOUNTER — Ambulatory Visit
Admission: RE | Admit: 2023-04-17 | Discharge: 2023-04-17 | Disposition: A | Discharge status: Home / Self Care | Payer: Managed Care, Other (non HMO) | Source: Ambulatory Visit | Attending: Obstetrics and Gynecology | Admitting: Obstetrics and Gynecology

## 2023-04-17 ENCOUNTER — Inpatient Hospital Stay
Admission: RE | Admit: 2023-04-17 | Discharge: 2023-04-17 | Discharge status: Home / Self Care | Payer: Managed Care, Other (non HMO) | Source: Ambulatory Visit | Attending: Obstetrics and Gynecology | Admitting: Obstetrics and Gynecology

## 2023-04-17 DIAGNOSIS — R928 Other abnormal and inconclusive findings on diagnostic imaging of breast: Secondary | ICD-10-CM

## 2024-01-27 DIAGNOSIS — R319 Hematuria, unspecified: Secondary | ICD-10-CM | POA: Diagnosis not present

## 2024-01-27 DIAGNOSIS — Z01419 Encounter for gynecological examination (general) (routine) without abnormal findings: Secondary | ICD-10-CM | POA: Diagnosis not present

## 2024-01-27 DIAGNOSIS — Z681 Body mass index (BMI) 19 or less, adult: Secondary | ICD-10-CM | POA: Diagnosis not present

## 2024-01-27 DIAGNOSIS — Z124 Encounter for screening for malignant neoplasm of cervix: Secondary | ICD-10-CM | POA: Diagnosis not present

## 2024-01-29 ENCOUNTER — Other Ambulatory Visit: Payer: Self-pay | Admitting: Obstetrics and Gynecology

## 2024-01-29 DIAGNOSIS — R92333 Mammographic heterogeneous density, bilateral breasts: Secondary | ICD-10-CM

## 2024-02-23 ENCOUNTER — Ambulatory Visit: Payer: Self-pay | Admitting: Family Medicine

## 2024-02-23 ENCOUNTER — Encounter: Payer: Self-pay | Admitting: Family Medicine

## 2024-02-23 ENCOUNTER — Ambulatory Visit: Payer: Managed Care, Other (non HMO) | Admitting: Family Medicine

## 2024-02-23 VITALS — BP 129/83 | HR 71 | Temp 98.1°F | Ht 63.0 in | Wt 103.8 lb

## 2024-02-23 DIAGNOSIS — Z0000 Encounter for general adult medical examination without abnormal findings: Secondary | ICD-10-CM

## 2024-02-23 DIAGNOSIS — Z23 Encounter for immunization: Secondary | ICD-10-CM

## 2024-02-23 DIAGNOSIS — D225 Melanocytic nevi of trunk: Secondary | ICD-10-CM | POA: Insufficient documentation

## 2024-02-23 LAB — LIPID PANEL
Cholesterol: 165 mg/dL (ref 0–200)
HDL: 77.5 mg/dL (ref >39.00)
LDL Cholesterol: 76 mg/dL (ref 0–99)
NonHDL: 87.97
Total CHOL/HDL Ratio: 2
Triglycerides: 60 mg/dL (ref 0.0–149.0)
VLDL: 12 mg/dL (ref 0.0–40.0)

## 2024-02-23 LAB — CBC WITH DIFFERENTIAL/PLATELET
Basophils Absolute: 0 K/uL (ref 0.0–0.1)
Basophils Relative: 0.7 % (ref 0.0–3.0)
Eosinophils Absolute: 0.1 K/uL (ref 0.0–0.7)
Eosinophils Relative: 2.2 % (ref 0.0–5.0)
HCT: 36.3 % (ref 36.0–46.0)
Hemoglobin: 12.4 g/dL (ref 12.0–15.0)
Lymphocytes Relative: 40.5 % (ref 12.0–46.0)
Lymphs Abs: 1.4 K/uL (ref 0.7–4.0)
MCHC: 34.1 g/dL (ref 30.0–36.0)
MCV: 90.1 fl (ref 78.0–100.0)
Monocytes Absolute: 0.3 K/uL (ref 0.1–1.0)
Monocytes Relative: 8.8 % (ref 3.0–12.0)
Neutro Abs: 1.6 K/uL (ref 1.4–7.7)
Neutrophils Relative %: 47.8 % (ref 43.0–77.0)
Platelets: 180 K/uL (ref 150.0–400.0)
RBC: 4.03 Mil/uL (ref 3.87–5.11)
RDW: 12.6 % (ref 11.5–15.5)
WBC: 3.4 K/uL — ABNORMAL LOW (ref 4.0–10.5)

## 2024-02-23 LAB — COMPREHENSIVE METABOLIC PANEL WITH GFR
ALT: 15 U/L (ref 0–35)
AST: 19 U/L (ref 0–37)
Albumin: 4.7 g/dL (ref 3.5–5.2)
Alkaline Phosphatase: 30 U/L — ABNORMAL LOW (ref 39–117)
BUN: 10 mg/dL (ref 6–23)
CO2: 27 meq/L (ref 19–32)
Calcium: 9.2 mg/dL (ref 8.4–10.5)
Chloride: 104 meq/L (ref 96–112)
Creatinine, Ser: 0.63 mg/dL (ref 0.40–1.20)
GFR: 110.73 mL/min (ref >60.00)
Glucose, Bld: 82 mg/dL (ref 70–99)
Potassium: 3.7 meq/L (ref 3.5–5.1)
Sodium: 138 meq/L (ref 135–145)
Total Bilirubin: 1.2 mg/dL (ref 0.2–1.2)
Total Protein: 6.9 g/dL (ref 6.0–8.3)

## 2024-02-23 LAB — TSH: TSH: 2.39 u[IU]/mL (ref 0.35–5.50)

## 2024-02-23 NOTE — Patient Instructions (Signed)
 Please return in 12 months for your annual complete physical; please come fasting.   I will release your lab results to you on your MyChart account with further instructions. You may see the results before I do, but when I review them I will send you a message with my report or have my assistant call you if things need to be discussed. Please reply to my message with any questions. Thank you!   If you have any questions or concerns, please don't hesitate to send me a message via MyChart or call the office at [phone]. Thank you for visiting with us  today! It's our pleasure caring for you.

## 2024-02-23 NOTE — Progress Notes (Signed)
 See mychart note Dear Ms. Bazin, Your numbers continue to support the notion that you are indeed healthy! Everything remains within range.   Take care! Sincerely, Dr. Jodie

## 2024-02-23 NOTE — Progress Notes (Signed)
 Subjective  Chief Complaint  Patient presents with   Annual Exam    Pt here for Annual Exam and is currently fasting     HPI: Desiree Garza is a 43 y.o. female who presents to [facility] [place] today for a Female Wellness Visit.   Wellness Visit: annual visit with health maintenance review and exam  HM: pap current. Mammo scheduled. Sees GYN. Imms: eligible for flu vaccine today. Healthy lifestyle. Dense breast tissue Grade D. To start MRI screens. Husband with vasectomy.Regular cycles.  Busy mom of 3 boys, 6, 8 and 11.   Assessment  1. Annual physical exam   2. Need for influenza vaccination   3. Gilbert's syndrome      Plan  Female Wellness Visit: Age appropriate Health Maintenance and Prevention measures were discussed with patient. Included topics are cancer screening recommendations, ways to keep healthy (see AVS) including dietary and exercise recommendations, regular eye and dental care, use of seat belts, and avoidance of moderate alcohol use and tobacco use.  BMI: discussed patient's BMI and encouraged positive lifestyle modifications to help get to or maintain a target BMI. HM needs and immunizations were addressed and ordered. See below for orders. See HM and immunization section for updates. Flu shot today Routine labs and screening tests ordered including cmp, cbc and lipids where appropriate. Discussed recommendations regarding Vit D and calcium supplementation (see AVS)  Follow up: 1 year for cpe  Orders Placed This Encounter  Procedures   Flu vaccine trivalent PF, 6mos and older(Flulaval,Afluria,Fluarix,Fluzone)   CBC with Differential/Platelet   Comprehensive metabolic panel with GFR   Lipid panel   TSH   No orders of the defined types were placed in this encounter.      Body mass index is 18.39 kg/m. Wt Readings from Last 3 Encounters:  02/23/24 103 lb 12.8 oz (47.1 kg)  02/21/23 97 lb 12.8 oz (44.4 kg)  02/18/22 98 lb 9.6 oz  (44.7 kg)    Patient Active Problem List   Diagnosis Date Noted   Melanocytic nevus of trunk 02/23/2024   Gilbert's syndrome 07/06/2020    Fractionated bili 2023; minimally elevated direct and indirect    LGSIL on Pap smear of cervix, history of, 2012, resolved 05/07/2010    COLPO 06/18/10 + HPV    Health Maintenance  Topic Date Due   Hepatitis B Vaccines 19-59 Average Risk (1 of 3 - 19+ 3-dose series) Never done   COVID-19 Vaccine (3 - Pfizer risk series) 03/10/2024 (Originally 11/15/2019)   Mammogram  04/07/2025   DTaP/Tdap/Td (3 - Td or Tdap) 10/23/2027   Cervical Cancer Screening (HPV/Pap Cotest)  12/19/2027   Influenza Vaccine  Completed   Hepatitis C Screening  Completed   HIV Screening  Completed   Pneumococcal Vaccine  Aged Out   Meningococcal B Vaccine  Aged Out   HPV VACCINES  Discontinued   Immunization History  Administered Date(s) Administered   HPV Quadrivalent 06/11/2003   Influenza Inj Mdck Quad Pf 03/21/2018   Influenza Split 05/09/2010, 04/09/2020   Influenza, Seasonal, Injecte, Preservative Fre 02/21/2023, 02/23/2024   Influenza,inj,Quad PF,6+ Mos 05/09/2010, 03/21/2018, 04/09/2019, 02/18/2022   PFIZER(Purple Top)SARS-COV-2 Vaccination 09/24/2019, 10/18/2019   PPD Test 01/19/2019   Rho (D) Immune Globulin  02/03/2013   Td 02/06/2006   Tdap 10/22/2017   We updated and reviewed the patient's past history in detail and it is documented below. Allergies: Patient has no active allergies. Past Medical History Patient  has a  past medical history of Anemia, Cervical dysplasia, History of cryosurgery, PONV (postoperative nausea and vomiting), PPH (postpartum hemorrhage) (11/04/2017 and 01/2013), and Vacuum extractor delivery, delivered (07/07/2015). Past Surgical History Patient  has a past surgical history that includes Gynecologic cryosurgery (2012); Wisdom tooth extraction (2006); Dilation and evacuation (N/A, 09/24/2016); urethrea stretching (1991); Dilation and  evacuation (N/A, 11/05/2016); and Dilation and evacuation (N/A, 11/04/2017). Family History: Patient family history includes Arthritis in her maternal grandfather, maternal grandmother, and paternal grandmother; Cancer in her father; Colon cancer in her maternal grandmother; Depression in her maternal grandmother; Diabetes in her maternal grandmother; Healthy in her son, son, and son; High Cholesterol in her father, maternal grandmother, and mother; High blood pressure in her maternal grandmother and paternal grandmother; Hypertension in her father and mother; Kidney disease in her maternal grandfather; Lung cancer in her maternal grandfather; Miscarriages / Stillbirths in her mother; Stroke in her paternal grandmother; Testicular cancer in her father; Thyroid disease in her maternal grandmother. Social History:  Patient  reports that she has never smoked. She has never used smokeless tobacco. She reports current alcohol use of about 2.0 standard drinks of alcohol per week. She reports that she does not use drugs.  Review of Systems: Constitutional: negative for fever or malaise Ophthalmic: negative for photophobia, double vision or loss of vision Cardiovascular: negative for chest pain, dyspnea on exertion, or new LE swelling Respiratory: negative for SOB or persistent cough Gastrointestinal: negative for abdominal pain, change in bowel habits or melena Genitourinary: negative for dysuria or gross hematuria, no abnormal uterine bleeding or disharge Musculoskeletal: negative for new gait disturbance or muscular weakness Integumentary: negative for new or persistent rashes, no breast lumps Neurological: negative for TIA or stroke symptoms Psychiatric: negative for SI or delusions Allergic/Immunologic: negative for hives Patient Care Team    Relationship Specialty Notifications Start End  Jodie Lavern CROME, MD PCP - General Family Medicine  06/24/18   Mat Browning, MD Consulting Physician  Obstetrics and Gynecology  06/24/18   Robinson Pao, MD Consulting Physician Dermatology  06/24/18   Optical, Red I    06/24/18   Renda Hun, DMD  Dentistry  06/24/18     Objective  Vitals: BP 129/83   Pulse 71   Temp 98.1 F (36.7 C)   Ht 5' 3 (1.6 m)   Wt 103 lb 12.8 oz (47.1 kg)   SpO2 99%   BMI 18.39 kg/m  General:  Well developed, well nourished, no acute distress  Psych:  Alert and orientedx3,normal mood and affect HEENT:  Normocephalic, atraumatic, non-icteric sclera, supple neck without adenopathy, mass or thyromegaly Cardiovascular:  Normal S1, S2, RRR without gallop, rub or murmur Respiratory:  Good breath sounds bilaterally, CTAB with normal respiratory effort Gastrointestinal: normal bowel sounds, soft, non-tender, no noted masses. No HSM MSK: no deformities, contusions. Joints are without erythema or swelling. Spine and CVA region are nontender Skin:  Warm, no rashes or suspicious lesions noted Neurologic:    Mental status is normal. Gross motor and sensory exams are normal. Normal gait. No tremor   Commons side effects, risks, benefits, and alternatives for medications and treatment plan prescribed today were discussed, and the patient expressed understanding of the given instructions. Patient is instructed to call or message via MyChart if he/she has any questions or concerns regarding our treatment plan. No barriers to understanding were identified. We discussed Red Flag symptoms and signs in detail. Patient expressed understanding regarding what to do in case of urgent or emergency type  symptoms.  Medication list was reconciled, printed and provided to the patient in AVS. Patient instructions and summary information was reviewed with the patient as documented in the AVS. This note was prepared with assistance of Dragon voice recognition software. Occasional wrong-word or sound-a-like substitutions may have occurred due to the inherent limitations of voice recognition  software

## 2024-03-02 ENCOUNTER — Other Ambulatory Visit

## 2024-03-09 ENCOUNTER — Encounter: Payer: Self-pay | Admitting: Obstetrics and Gynecology

## 2024-03-15 ENCOUNTER — Other Ambulatory Visit: Payer: Self-pay | Admitting: Obstetrics and Gynecology

## 2024-03-15 DIAGNOSIS — R9230 Dense breasts, unspecified: Secondary | ICD-10-CM

## 2024-03-16 ENCOUNTER — Other Ambulatory Visit

## 2024-03-16 ENCOUNTER — Ambulatory Visit
Admission: RE | Admit: 2024-03-16 | Discharge: 2024-03-16 | Disposition: A | Discharge status: Home / Self Care | Source: Ambulatory Visit | Attending: Obstetrics and Gynecology | Admitting: Obstetrics and Gynecology

## 2024-03-16 DIAGNOSIS — R9230 Dense breasts, unspecified: Secondary | ICD-10-CM

## 2024-03-16 MED ORDER — GADOPICLENOL 0.5 MMOL/ML IV SOLN
5.0000 mL | Freq: Once | INTRAVENOUS | Status: AC | PRN
  Administered 2024-03-16: 5 mL via INTRAVENOUS

## 2024-04-01 ENCOUNTER — Other Ambulatory Visit: Payer: Self-pay | Admitting: Obstetrics and Gynecology

## 2024-04-01 DIAGNOSIS — Z1231 Encounter for screening mammogram for malignant neoplasm of breast: Secondary | ICD-10-CM

## 2024-04-21 ENCOUNTER — Ambulatory Visit
Admission: RE | Admit: 2024-04-21 | Discharge: 2024-04-21 | Disposition: A | Discharge status: Home / Self Care | Payer: Self-pay | Source: Ambulatory Visit | Attending: Obstetrics and Gynecology | Admitting: Obstetrics and Gynecology

## 2024-04-21 DIAGNOSIS — Z1231 Encounter for screening mammogram for malignant neoplasm of breast: Secondary | ICD-10-CM | POA: Diagnosis not present

## 2025-03-08 ENCOUNTER — Encounter: Admitting: Family Medicine
